# Patient Record
Sex: Male | Born: 1945 | Race: White | Hispanic: No | Marital: Married | Smoking: Never smoker
Health system: Southern US, Community
[De-identification: ages and names within clinical notes are randomized; demographics above are authoritative.]

## PROBLEM LIST (undated history)

## (undated) DIAGNOSIS — N189 Chronic kidney disease, unspecified: Secondary | ICD-10-CM

## (undated) DIAGNOSIS — Z941 Heart transplant status: Secondary | ICD-10-CM

## (undated) DIAGNOSIS — I10 Essential (primary) hypertension: Secondary | ICD-10-CM

## (undated) DIAGNOSIS — G4733 Obstructive sleep apnea (adult) (pediatric): Secondary | ICD-10-CM

## (undated) DIAGNOSIS — M109 Gout, unspecified: Secondary | ICD-10-CM

## (undated) DIAGNOSIS — E7800 Pure hypercholesterolemia, unspecified: Secondary | ICD-10-CM

## (undated) HISTORY — DX: Chronic kidney disease, unspecified: N18.9

## (undated) HISTORY — PX: HERNIA REPAIR: SHX51

## (undated) HISTORY — DX: Obstructive sleep apnea (adult) (pediatric): G47.33

## (undated) HISTORY — PX: CYSTECTOMY: SUR359

---

## 2000-08-27 HISTORY — PX: HEART TRANSPLANT: SHX268

## 2000-08-27 HISTORY — PX: PERICARDIUM SURGERY: SHX742

## 2000-11-11 ENCOUNTER — Ambulatory Visit (HOSPITAL_COMMUNITY): Admission: RE | Admit: 2000-11-11 | Discharge: 2000-11-11 | Payer: Self-pay | Admitting: Cardiology

## 2000-11-17 ENCOUNTER — Encounter: Payer: Self-pay | Admitting: Emergency Medicine

## 2000-11-17 ENCOUNTER — Encounter (INDEPENDENT_AMBULATORY_CARE_PROVIDER_SITE_OTHER): Payer: Self-pay | Admitting: Specialist

## 2000-11-17 ENCOUNTER — Inpatient Hospital Stay (HOSPITAL_COMMUNITY): Admission: EM | Admit: 2000-11-17 | Discharge: 2000-11-22 | Payer: Self-pay | Admitting: Emergency Medicine

## 2000-11-21 ENCOUNTER — Encounter: Payer: Self-pay | Admitting: Cardiology

## 2000-11-22 ENCOUNTER — Encounter: Payer: Self-pay | Admitting: Cardiology

## 2001-01-03 ENCOUNTER — Emergency Department (HOSPITAL_COMMUNITY): Admission: EM | Admit: 2001-01-03 | Discharge: 2001-01-03 | Payer: Self-pay | Admitting: Emergency Medicine

## 2001-04-01 ENCOUNTER — Encounter: Admission: RE | Admit: 2001-04-01 | Discharge: 2001-04-17 | Payer: Self-pay | Admitting: Internal Medicine

## 2001-05-20 ENCOUNTER — Encounter (HOSPITAL_COMMUNITY): Admission: RE | Admit: 2001-05-20 | Discharge: 2001-08-18 | Payer: Self-pay | Admitting: Cardiology

## 2001-08-19 ENCOUNTER — Encounter (HOSPITAL_COMMUNITY): Admission: RE | Admit: 2001-08-19 | Discharge: 2001-10-06 | Payer: Self-pay | Admitting: Cardiology

## 2003-08-28 HISTORY — PX: CARDIAC SURGERY: SHX584

## 2003-08-28 HISTORY — PX: ENDOMYOCARDIAL BIOPSY: SHX265

## 2003-11-21 ENCOUNTER — Inpatient Hospital Stay (HOSPITAL_COMMUNITY): Admission: EM | Admit: 2003-11-21 | Discharge: 2003-11-22 | Payer: Self-pay | Admitting: Emergency Medicine

## 2004-07-06 ENCOUNTER — Ambulatory Visit (HOSPITAL_COMMUNITY): Admission: RE | Admit: 2004-07-06 | Discharge: 2004-07-06 | Payer: Self-pay | Admitting: *Deleted

## 2004-10-10 ENCOUNTER — Emergency Department (HOSPITAL_COMMUNITY): Admission: EM | Admit: 2004-10-10 | Discharge: 2004-10-10 | Payer: Self-pay | Admitting: Emergency Medicine

## 2006-05-24 ENCOUNTER — Encounter: Admission: RE | Admit: 2006-05-24 | Discharge: 2006-05-24 | Payer: Self-pay | Admitting: Nephrology

## 2008-02-17 ENCOUNTER — Emergency Department (HOSPITAL_COMMUNITY): Admission: EM | Admit: 2008-02-17 | Discharge: 2008-02-17 | Payer: Self-pay | Admitting: Emergency Medicine

## 2008-08-27 HISTORY — PX: PERICARDIOCENTESIS: SHX2215

## 2008-10-18 ENCOUNTER — Encounter: Admission: RE | Admit: 2008-10-18 | Discharge: 2008-10-18 | Payer: Self-pay | Admitting: Surgical Oncology

## 2009-11-01 ENCOUNTER — Encounter (INDEPENDENT_AMBULATORY_CARE_PROVIDER_SITE_OTHER): Payer: Self-pay | Admitting: *Deleted

## 2009-11-04 ENCOUNTER — Encounter: Payer: Self-pay | Admitting: Gastroenterology

## 2009-11-10 ENCOUNTER — Encounter (INDEPENDENT_AMBULATORY_CARE_PROVIDER_SITE_OTHER): Payer: Self-pay | Admitting: *Deleted

## 2009-11-11 ENCOUNTER — Ambulatory Visit: Payer: Self-pay | Admitting: Gastroenterology

## 2009-11-15 ENCOUNTER — Encounter: Payer: Self-pay | Admitting: Gastroenterology

## 2009-11-16 ENCOUNTER — Telehealth: Payer: Self-pay | Admitting: Gastroenterology

## 2009-11-28 ENCOUNTER — Telehealth (INDEPENDENT_AMBULATORY_CARE_PROVIDER_SITE_OTHER): Payer: Self-pay | Admitting: *Deleted

## 2009-11-28 ENCOUNTER — Encounter (INDEPENDENT_AMBULATORY_CARE_PROVIDER_SITE_OTHER): Payer: Self-pay | Admitting: *Deleted

## 2009-12-29 ENCOUNTER — Ambulatory Visit (HOSPITAL_COMMUNITY): Admission: RE | Admit: 2009-12-29 | Discharge: 2009-12-29 | Payer: Self-pay | Admitting: Gastroenterology

## 2009-12-29 ENCOUNTER — Ambulatory Visit: Payer: Self-pay | Admitting: Gastroenterology

## 2010-09-28 NOTE — Progress Notes (Signed)
Summary: Colon  Phone Note Outgoing Call Call back at Home Phone [phone]   Call placed by: Christian Mate CMA Deborra Medina),  November 28, 2009 9:29 AM Summary of Call: pt scheduled for colon meds reviewed and instructed.  will call is any questions after recieving in the mail.  pt already has prep. Initial call taken by: Christian Mate CMA Deborra Medina),  November 28, 2009 9:29 AM  New Problems: SPECIAL SCREENING FOR MALIGNANT NEOPLASMS COLON (ICD-V76.51)   New Problems: SPECIAL SCREENING FOR MALIGNANT NEOPLASMS COLON (ICD-V76.51)

## 2010-09-28 NOTE — Letter (Signed)
Summary: [facility]   Imported By: Phillis Knack 11/29/2009 09:26:40  _____________________________________________________________________  External Attachment:    Type:   Image     Comment:   External Document

## 2010-09-28 NOTE — Miscellaneous (Signed)
Summary: previsit/rm  Clinical Lists Changes  Medications: Added new medication of MOVIPREP 100 GM  SOLR (PEG-KCL-NACL-NASULF-NA ASC-C) As per prep instructions. - Signed Rx of MOVIPREP 100 GM  SOLR (PEG-KCL-NACL-NASULF-NA ASC-C) As per prep instructions.;  #1 x 0;  Signed;  Entered by: Sundra Aland RN;  Authorized by: Milus Banister MD;  Method used: Electronically to [facility] [phone]*, [address], [place], [place]  [address], Ph: [phone], Fax: [phone] Observations: Added new observation of ALLERGY REV: Done (11/11/2009 13:42) Added new observation of NKA: T (11/11/2009 13:42)    Prescriptions: MOVIPREP 100 GM  SOLR (PEG-KCL-NACL-NASULF-NA ASC-C) As per prep instructions.  #1 x 0   Entered by:   Sundra Aland RN   Authorized by:   Milus Banister MD   Signed by:   Sundra Aland RN on 11/11/2009   Method used:   Electronically to        [facility] [phone]* (retail)       [address]       [place], [place]  [address]       Ph: [phone]       Fax: [phone]   RxID:   [id number]

## 2010-09-28 NOTE — Procedures (Signed)
Summary: Colonoscopy  Patient: [name] Note: All result statuses are Final unless otherwise noted.  Tests: (1) Colonoscopy (COL)   COL Colonoscopy           [facility]     [place], [place]  [address]           COLONOSCOPY PROCEDURE REPORT           PATIENT:  Brian Suarez, Brian Suarez  MR#:  [id number]     BIRTHDATE:  04/10/1946, 63 yrs. old  GENDER:  male     ENDOSCOPIST:  Milus Banister, MD     REF. BY:  Jani Gravel, M.D.     PROCEDURE DATE:  12/29/2009     PROCEDURE:  Diagnostic Colonoscopy     ASA CLASS:  Class II     INDICATIONS:  Routine Risk Screening; colonoscopy 06/2004 with Dr.     Lajoyce Corners found no polyps, was recommended to have repeat in 5 years     MEDICATIONS:   MAC sedation, administered by CRNA           DESCRIPTION OF PROCEDURE:   After the risks benefits and     alternatives of the procedure were thoroughly explained, informed     consent was obtained.  Digital rectal exam was performed and     revealed no rectal masses.   The  endoscope was introduced through     the anus and advanced to the cecum, which was identified by both     the appendix and ileocecal valve, without limitations.  The     quality of the prep was good, using MoviPrep.  The instrument was     then slowly withdrawn as the colon was fully examined.     <<PROCEDUREIMAGES>>     FINDINGS:  Mild diverticulosis was found in the sigmoid to     descending colon segments (see image3).  This was otherwise a     normal examination of the colon (see image4, image2, and image1).     Retroflexed views in the rectum revealed no abnormalities.    The     scope was then withdrawn from the patient and the procedure     completed.           COMPLICATIONS:  None           ENDOSCOPIC IMPRESSION:     1) Mild diverticulosis in the sigmoid to descending colon     segments     2) Otherwise normal examination; no polyps or cancers           RECOMMENDATIONS:     1) Continue current  colorectal screening recommendations for     "routine risk" patients with a repeat colonoscopy in 10 years.           REPEAT EXAM:  10 years           ______________________________     Milus Banister, MD           n.     eSIGNED:   Milus Banister at 12/29/2009 10:30 AM           Eugenio Hoes, [id number]  Note: An exclamation mark (!) indicates a result that was not dispersed into the flowsheet. Document Creation Date: 12/29/2009 10:31 AM _______________________________________________________________________  (1) Order result status: Final Collection or observation date-time: 12/29/2009 10:20 Requested date-time:  Receipt date-time:  Reported date-time:  Referring  Physician:   Ordering Physician: Owens Loffler [phone]) Specimen Source:  Source: Tawanna Cooler Order Number: [id number] Lab site:

## 2010-09-28 NOTE — Letter (Signed)
Summary: [facility] Instructions  [facility]  [place], [place] [address]   Phone: [phone]  Fax: [phone]       Brian Suarez    11-Feb-1946    MRN: [id number]        Procedure Day /Date:12/29/09     Arrival Time:8 am       Procedure Time:10 am     Location of Procedure:                     X  [facility] ( Outpatient Registration)                        PREPARATION FOR COLONOSCOPY WITH MOVIPREP   Starting 5 days prior to your procedure 12/23/09  do not eat nuts, seeds, popcorn, corn, beans, peas,  salads, or any raw vegetables.  Do not take any fiber supplements (e.g. Metamucil, Citrucel, and Benefiber).  THE DAY BEFORE YOUR PROCEDURE         DATE: 12/28/09  DAY:   WED _ 1.  Drink clear liquids the entire day-NO SOLID FOOD  2.  Do not drink anything colored red or purple.  Avoid juices with pulp.  No orange juice.  3.  Drink at least 64 oz. (8 glasses) of fluid/clear liquids during the day to prevent dehydration and help the prep work efficiently.  CLEAR LIQUIDS INCLUDE: Water Jello Ice Popsicles Tea (sugar ok, no milk/cream) Powdered fruit flavored drinks Coffee (sugar ok, no milk/cream) Gatorade Juice: apple, white grape, white cranberry  Lemonade Clear bullion, consomm, broth Carbonated beverages (any kind) Strained chicken noodle soup Hard Candy                             4.  In the morning, mix first dose of MoviPrep solution:    Empty 1 Pouch A and 1 Pouch B into the disposable container    Add lukewarm drinking water to the top line of the container. Mix to dissolve    Refrigerate (mixed solution should be used within 24 hrs)  5.  Begin drinking the prep at 5:00 p.m. The MoviPrep container is divided by 4 marks.   Every 15 minutes drink the solution down to the next mark (approximately 8 oz) until the full liter is complete.   6.  Follow completed prep with 16 oz of clear liquid of your choice (Nothing red or  purple).  Continue to drink clear liquids until bedtime.  7.  Before going to bed, mix second dose of MoviPrep solution:    Empty 1 Pouch A and 1 Pouch B into the disposable container    Add lukewarm drinking water to the top line of the container. Mix to dissolve    Refrigerate  THE DAY OF YOUR PROCEDURE      DATE: 12/29/09 DAY: THUR  Beginning at 5 a.m. (5 hours before procedure):         1. Every 15 minutes, drink the solution down to the next mark (approx 8 oz) until the full liter is complete.  Nothing to eat or drink after midnight except the prep solution  MEDICATION INSTRUCTIONS  Unless otherwise instructed, you should take regular prescription medications with a small sip of water   as early as possible the morning of your procedure.         OTHER INSTRUCTIONS  You will need a  responsible adult at least 65 years of age to accompany you and drive you home.   This person must remain in the waiting room during your procedure.  Wear loose fitting clothing that is easily removed.  Leave jewelry and other valuables at home.  However, you may wish to bring a book to read or  an iPod/MP3 player to listen to music as you wait for your procedure to start.  Remove all body piercing jewelry and leave at home.  Total time from sign-in until discharge is approximately 2-3 hours.  You should go home directly after your procedure and rest.  You can resume normal activities the  day after your procedure.  The day of your procedure you should not:   Drive   Make legal decisions   Operate machinery   Drink alcohol   Return to work  You will receive specific instructions about eating, activities and medications before you leave.    The above instructions have been reviewed and explained to me by   Christian Mate CMA Deborra Medina)  November 28, 2009 9:45 AM     I fully understand and can verbalize these instructions over the phone mailed to home Date 11/28/09

## 2010-09-28 NOTE — Letter (Signed)
Summary: Previsit letter  [facility]  [place], [place] [address]   Phone: [phone]  Fax: [phone]       11/01/2009 MRN: [id number]  [facility] [place], [place]  [address]  Dear Brian Suarez,  Welcome to the Gastroenterology Division at [facility].    You are scheduled to see a nurse for your pre-procedure visit on 11-15-09 at 3:30p.m. on the 3rd floor at [facility], [place] [employer].  We ask that you try to arrive at our office 15 minutes prior to your appointment time to allow for check-in.  Your nurse visit will consist of discussing your medical and surgical history, your immediate family medical history, and your medications.    Please bring a complete list of all your medications or, if you prefer, bring the medication bottles and we will list them.  We will need to be aware of both prescribed and over the counter drugs.  We will need to know exact dosage information as well.  If you are on blood thinners (Coumadin, Plavix, Aggrenox, Ticlid, etc.) please call our office today/prior to your appointment, as we need to consult with your physician about holding your medication.   Please be prepared to read and sign documents such as consent forms, a financial agreement, and acknowledgement forms.  If necessary, and with your consent, a friend or relative is welcome to sit-in on the nurse visit with you.  Please bring your insurance card so that we may make a copy of it.  If your insurance requires a referral to see a specialist, please bring your referral form from your primary care physician.  No co-pay is required for this nurse visit.     If you cannot keep your appointment, please call [phone] to cancel or reschedule prior to your appointment date.  This allows [place] the opportunity to schedule an appointment for another patient in need of care.    Thank you for choosing [facility] for your  medical needs.  We appreciate the opportunity to care for you.  Please visit [place] at our website  to learn more about our practice.                     Sincerely.                                                                                                                   The Gastroenterology Division

## 2010-09-28 NOTE — Letter (Signed)
Summary: Moviprep Instructions  [facility]  [address]   [place], [place] [address]   Phone: [phone]  Fax: [phone]       ANGELOS OTTMAR    1946-07-25    MRN: [id number]        Procedure Day Sudie Grumbling: 11-29-09, Tuesday     Arrival Time: 9:00 a.m.      Procedure Time: 10:00 a.m.     Location of Procedure:                    x   [facility] (4th Floor)                        [facility]   Starting 5 days prior to your procedure  11-24-09 do not eat nuts, seeds, popcorn, corn, beans, peas,  salads, or any raw vegetables.  Do not take any fiber supplements (e.g. Metamucil, Citrucel, and Benefiber).  THE DAY BEFORE YOUR PROCEDURE         DATE:  11-28-09   DAY: Monday  1.  Drink clear liquids the entire day-NO SOLID FOOD  2.  Do not drink anything colored red or purple.  Avoid juices with pulp.  No orange juice.  3.  Drink at least 64 oz. (8 glasses) of fluid/clear liquids during the day to prevent dehydration and help the prep work efficiently.  CLEAR LIQUIDS INCLUDE: Water Jello Ice Popsicles Tea (sugar ok, no milk/cream) Powdered fruit flavored drinks Coffee (sugar ok, no milk/cream) Gatorade Juice: apple, white grape, white cranberry  Lemonade Clear bullion, consomm, broth Carbonated beverages (any kind) Strained chicken noodle soup Hard Candy                             4.  In the morning, mix first dose of MoviPrep solution:    Empty 1 Pouch A and 1 Pouch B into the disposable container    Add lukewarm drinking water to the top line of the container. Mix to dissolve    Refrigerate (mixed solution should be used within 24 hrs)  5.  Begin drinking the prep at 5:00 p.m. The MoviPrep container is divided by 4 marks.   Every 15 minutes drink the solution down to the next mark (approximately 8 oz) until the full liter is complete.   6.  Follow completed prep with 16 oz of clear liquid of your choice  (Nothing red or purple).  Continue to drink clear liquids until bedtime.  7.  Before going to bed, mix second dose of MoviPrep solution:    Empty 1 Pouch A and 1 Pouch B into the disposable container    Add lukewarm drinking water to the top line of the container. Mix to dissolve    Refrigerate  THE DAY OF YOUR PROCEDURE      DATE: 11-29-09   DAY: Tuesday  Beginning at 5:00 a.m. (5 hours before procedure):         1. Every 15 minutes, drink the solution down to the next mark (approx 8 oz) until the full liter is complete.  2. Follow completed prep with 16 oz. of clear liquid of your choice.    3. You may drink clear liquids until 8:00 a.m.  (2 HOURS BEFORE PROCEDURE).   MEDICATION INSTRUCTIONS  Unless otherwise instructed, you should take regular prescription medications with a small sip of water  as early as possible the morning of your procedure.   Additional medication instructions: n/a         OTHER INSTRUCTIONS  You will need a responsible adult at least 65 years of age to accompany you and drive you home.   This person must remain in the waiting room during your procedure.  Wear loose fitting clothing that is easily removed.  Leave jewelry and other valuables at home.  However, you may wish to bring a book to read or  an iPod/MP3 player to listen to music as you wait for your procedure to start.  Remove all body piercing jewelry and leave at home.  Total time from sign-in until discharge is approximately 2-3 hours.  You should go home directly after your procedure and rest.  You can resume normal activities the  day after your procedure.  The day of your procedure you should not:   Drive   Make legal decisions   Operate machinery   Drink alcohol   Return to work  You will receive specific instructions about eating, activities and medications before you leave.    The above instructions have been reviewed and explained to me by   Sundra Aland RN   November 11, 2009 2:34 PM     I fully understand and can verbalize these instructions _____________________________ Date _________

## 2010-09-28 NOTE — Progress Notes (Signed)
Summary: prep type  Phone Note Call from Patient Call back at Home Phone [phone]   Caller: Patient Call For: Dr. Ardis Hughs Reason for Call: Talk to Nurse Summary of Call: pt says that his kidney doctor would like our office to call him and let him know what type of prep pt used for his COL and what is in it... Dr. Salem Senate, [phone] Initial call taken by: Lucien Mons,  November 16, 2009 12:42 PM  Follow-up for Phone Call        called and gave the nurse at Dr Fox's office the name of the prep.  (Movi prep).     Appended Document: prep type can you please cancel his upcoming [place] colonscopy (next tuesday).  He had a heart transplant in 2002, has significant kidney disfuntion (creatinine of 2), I think he is safest NOT to be done in [place].  He will need to be changed to anesthesia assisted (propofol) case at [facility], next available EUS thursday.  Appended Document: prep type appt cx will call pt next week to reschedule  Appended Document: prep type colon scheduled

## 2010-11-14 LAB — CBC
HCT: 40.2 % (ref 39.0–52.0)
Hemoglobin: 13.1 g/dL (ref 13.0–17.0)
Platelets: 140 10*3/uL — ABNORMAL LOW (ref 150–400)
RBC: 4.46 MIL/uL (ref 4.22–5.81)
WBC: 8.3 10*3/uL (ref 4.0–10.5)

## 2010-11-14 LAB — COMPREHENSIVE METABOLIC PANEL
Albumin: 4 g/dL (ref 3.5–5.2)
Alkaline Phosphatase: 72 U/L (ref 39–117)
BUN: 45 mg/dL — ABNORMAL HIGH (ref 6–23)
CO2: 27 mEq/L (ref 19–32)
Chloride: 104 mEq/L (ref 96–112)
GFR calc non Af Amer: 26 mL/min — ABNORMAL LOW (ref >60)
Potassium: 4.6 mEq/L (ref 3.5–5.1)
Total Bilirubin: 1.7 mg/dL — ABNORMAL HIGH (ref 0.3–1.2)

## 2010-11-14 LAB — DIFFERENTIAL
Basophils Absolute: 0 10*3/uL (ref 0.0–0.1)
Basophils Relative: 0 % (ref 0–1)
Eosinophils Relative: 1 % (ref 0–5)
Monocytes Absolute: 0.8 10*3/uL (ref 0.1–1.0)
Neutro Abs: 6.1 10*3/uL (ref 1.7–7.7)

## 2010-11-27 ENCOUNTER — Other Ambulatory Visit: Payer: Self-pay | Admitting: Dermatology

## 2011-01-12 NOTE — Op Note (Signed)
NAME:  Brian Suarez, STOCKS NO.:  [id number]   MEDICAL RECORD NO.:  [id number]          PATIENT TYPE:  AMB   LOCATION:  ENDO                         FACILITY:  [facility]   PHYSICIAN:  Waverly Ferrari, M.D.    DATE OF BIRTH:  06-06-1946   DATE OF PROCEDURE:  DATE OF DISCHARGE:                                 OPERATIVE REPORT   PROCEDURE:  Colonoscopy.   SURGEON:   INDICATIONS FOR PROCEDURE:  Colon cancer screening.   ANESTHESIA:  Demerol 75 and Versed 5 mg.   DESCRIPTION OF PROCEDURE:  With the patient mildly sedated in the left  lateral decubitus position, a rectal examination was performed which was  unremarkable.  Subsequently the Olympus videoscopic colonoscope was inserted  into the rectum, passed under direct vision to the cecum, identified by  ileocecal valve and appendiceal orifice both of which were photographed.  From this point the colonoscope was slowly withdrawn taking circumferential  views of the colonic mucosa, stopping only in the rectum which appeared  normal with rectum retroflexed view.  The endoscope was straightened and  withdrawn.  The patient's vital signs and pulse oximeter remained stable.  The patient tolerated the procedure well without apparent complications.   FINDINGS:  Unremarkable examination.   PLAN:  Have patient follow up with me on an as needed basis.       GMO/MEDQ  D:  07/06/2004  T:  07/06/2004  Job:  [id number]

## 2011-01-12 NOTE — Discharge Summary (Signed)
[place] [place] [facility]  Patient:    Brian Suarez, Brian Suarez                       MRN: [id number] Adm. Date:  [id number] Disc. Date: [id number] Attending:  Molpus, John L CC:         Richard A. Rollene Fare, M.D.  John R. Glade Lloyd, M.D.  Theodoro Parma. Francina Ames., M.D.   Discharge Summary  CHIEF COMPLAINT:  Shortness of breath.  SUMMARY:  This 65 year old white male, patient of Dr. Harley Alto and Dr. Roe Rutherford, who has had an known dilated cardiomyopathy for approximately seven or eight years, had been noted to develop a pericardial effusion within the past month.  This effusion was tapped on November 11, 2000. The volume of drainage was 850 cc.  His symptoms improved over the next several day but at the time of admission he had severely increased dyspnea over approximately three days.  DETAILS OF PAST MEDICAL, FAMILY, AND SOCIAL HISTORY:  To be found a relevant degree in the history and physical.  LABORATORY DATA:  Chest x-ray showed pulmonary edema and cardiac enlargement.  Creatinine 1.2, BUN 17, sodium 130, potassium 5.0.  Slightly elevated liver enzymes.  ALT 93, AST 46, albumin 3.0.  White cells 11,700, hematocrit 40, mean cell volume 35, platelets 262,000.  CPK with MB and troponin I unremarkable.  Blood culture was negative.  Fluid culture was negative.  COURSE IN HOSPITAL:  He was admitted to a telemetry bed and placed on oxygen. His condition did not improve significantly.  He was noted to have very marked pulsus paradoxus with radial pulses not palpable with inspiration, consistent with a systolic drop of at least 40 to 50 mmHg, and remained markedly dyspneic with minimal exertion.  He was seen in consultation by the cardiovascular thoracic surgery service, and on November 21, 2000 pericardial window was placed by Dr. Arlyce Dice.  Specimens were sent.  He had the rapid onset of severe hemodynamic decompensation following that procedure, and developed  acute pulmonary edema requiring intubation.  He developed severe hypotension.  He was seen in consultation by Dr. Rollene Fare and by the critical care service. With very aggressive hemodynamic measures they were able to keep the patient reasonably stable overnight.  The following day Dr. Rollene Fare made arrangements to transfer the patient to [facility] for possible cardiac transplant. This was carried out on November 22, 2000.  He was transferred at 9:40 a.m.  FINAL DIAGNOSES: 1. 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 2. Pericardial tamponade. 3. Severe hemodynamic decompensation following placement of pericardial    window, with severe hypotension and florid pulmonary edema, refractory    to therapy. 4. Left bundle branch block.  OPERATIONS:  Pericardiotomy with pericardial biopsy.  COMPLICATIONS:  Severe hemodynamic decompensation/cardiogenic shock.  CONDITION ON DISCHARGE:  Stable on continuous ventilation.  RETURN TO WORK:  Inapplicable. DD:  01/29/01 TD:  01/29/01 Job: [id number]

## 2011-01-12 NOTE — Op Note (Signed)
NAME:  Brian Suarez, Brian Suarez NO.:  [id number]   MEDICAL RECORD NO.:  [id number]          PATIENT TYPE:  AMB   LOCATION:  ENDO                         FACILITY:  [facility]   PHYSICIAN:  Waverly Ferrari, M.D.    DATE OF BIRTH:  [birth year]   DATE OF PROCEDURE:  07/06/2004  DATE OF DISCHARGE:                                 OPERATIVE REPORT   PROCEDURE PERFORMED:  Upper endoscopy.   ENDOSCOPIST:  Waverly Ferrari, M.D.   INDICATIONS FOR PROCEDURE:  Gastroesophageal reflux disease.   ANESTHESIA:  Versed 2.5 mg.   DESCRIPTION OF PROCEDURE:  With the patient mildly sedated in the left  lateral decubitus position, the Olympus videoscopic endoscope was inserted  in the mouth and passed under direct vision through the esophagus which  appeared normal.  There was no evidence of Barrett's esophagus.  We entered  into the stomach.  The fundus, body, antrum, duodenal bulb and second  portion of the duodenum were visualized.  From this point, the endoscope was  slowly withdrawn taking circumferential views of the duodenal mucosa until  the endoscope was pulled back into the stomach and placed on retroflexion to  view the stomach from below.  The endoscope was then straightened and  withdrawn taking circumferential views of the remaining gastric and  esophageal mucosa.  The patient's vital signs and pulse oximeter remained  stable.  The patient tolerated the procedure well without apparent  complications.   FINDINGS:  Unremarkable examination.   PLAN:  Proceed to colonoscopy.       GMO/MEDQ  D:  07/06/2004  T:  07/06/2004  Job:  [id number]

## 2011-01-12 NOTE — H&P (Signed)
[place]. [facility]  Patient:    Brian Suarez, Brian Suarez                       MRN: [id number] Adm. Date:  [id number] Attending:  Jeannine Kitten CC:         Freeman Caldron. Pauline Aus, M.D.  Theodoro Parma. Francina Ames., M.D.   History and Physical  PREOPERATIVE DIAGNOSES:  Recurrent pericardial effusion, pulmonary hypertension, dilated cardiomyopathy.  POSTOPERATIVE DIAGNOSES:  Recurrent pericardial effusion, pulmonary hypertension, dilated cardiomyopathy.  OPERATION PERFORMED:  Subxiphoid pericardial ______.  SURGEON:  D. Marlyn Corporal, M.D.  FIRST ASSISTANT:  Claudette Knell.  ANESTHESIA:  General.  This patient developed pericardial effusion and underwent a tap that showed a bloody effusion.  Cultures and cytology were apparently negative but the effusion occurred rapidly.  He was brought to the operating room for drainage. He had had some mild symptoms on echocardiogram of ______.  After general anesthesia an endotracheal tube was inserted and he was prepped and draped in the usual sterile manner.  An incision was made from the xiphoid about 6-7 cm down toward the umbilicus.  Dissection was carried down through the subcutaneous tissues through the pretracheal fascia.  The dissection was carried up underneath the xiphoid and the sternum up to the pericardium.  The pericardium was grasped with a ______ clamp and then opened with knives and dark bloody effusion emerged.  He had some mild increase in his pressure 10-15 points but he still maintained his tachycardia.  Pericardial fluid was sent again for cytologies and again for cultures and then he was opened approximately 3-4 cm.  Then a ethicon stapler was inserted and with three or four applications of the stapler the lesion of the pericardium was removed and sent for frozen section which revealed an inflammatory reaction.  Another piece of pericardium was taken with the stapler laterally and this was sent for  permanent section.  After approximately a 6 x 5 cm piece of opening was made two chest tubes were brought in through separate stab wounds on each side of the incision, a right angle chest tube on the right side and a straight chest tube on the left side.  Then the fascia was closed with 1-0 Vicryl in an interrupted fashion, 2-0 Vicryl in the subcutaneous tissue, and Steri-Strips. Patient was returned to recovery room in stable condition. DD:  11/21/00 TD:  11/21/00 Job: [id number]

## 2011-01-12 NOTE — Procedures (Signed)
[place]. [facility]  Patient:    Brian Suarez, Brian Suarez                       MRN: [id number] Adm. Date:  [id number] Disc. Date: [id number] Attending:  Jeannine Kitten CC:         Freeman Caldron. Pauline Aus, M.D.  John R. Glade Lloyd, M.D.  Theodoro Parma. Francina Ames., M.D.  Cardiac Catheterization Laboratory   Procedure Report  PROCEDURE:  Insertion of intra-aortic balloon pump, percutaneous technique, right femoral artery.  BRIEF HISTORY:  Brian Suarez is a 65 year old white married gentleman who is a longterm patient of Dr. Harley Alto and Drs. Grove and [facility] Pride.  He has an idiopathic, nonischemic cardiomyopathy with past estimated EF in the 20-25% range with associated pulmonary hypertension.  He has apparently been seen at [facility] approximately five to six years ago.  He has gotten along fairly well on medical therapy, which has included digitalis, Aldactone, ACE inhibitors, Coreg 3.125 mg b.i.d., chronic Coumadin therapy, and Lasix.  The patient is married with three sons and was working at [employer].  He developed a pericardial effusion and underwent elective pericardiocentesis by Dr. Glade Lloyd on November 01, 2000, in which the pericardial effusion was drained of approximately 850 cc of serosanguineous fluid.  The patient apparently developed increasing symptoms of shortness of breath and chest discomfort.  He was readmitted to the hospital on November 17, 2000, with severe congestive heart failure.  He was found to have a recurrent large pericardial effusion, and Dr. Pauline Aus felt that he might have some tamponade physiology.  Blood pressures have remained low in the 95 mmHg range chronically.  Cardiovascular surgical consultation was obtained with Dr. Arlyce Dice, and the patients Coumadin was held.  The patient had been receiving diuretics in the hospital and had prerenal azotemia with BUN and creatinine initially 17/1.2, subsequently  rising to 1.6 and BUN of 40 with diuresis and pericardial effusion.  Borderline TSH elevation of 5.6.  On November 21, 2000, he underwent pericardial window by subxiphoid approach by Dr. Arlyce Dice with pericardial biopsy and drainage.  Fluids were sent for appropriate cytology, culture, AFB, and fungus.  He came back to the PACU in stable condition with two chest tubes.  He subsequently developed progressive hypotension requiring dopamine and progressive hypoxemia, with blood pressures in the 70 mmHg range.  He was seen by me earlier this evening on high-dose dopamine with blood pressures of 60-70 mmHg.  He was intubated on 100% O2 with O2 desaturation and PO2s in the 70s.  He had had only scant urine output with high-dose diuretics.  His hypotension was felt to be related to possible relative intravascular hypovolemia, and he was initially given fluids.  A Swan-Ganz catheter was inserted percutaneously through the right subclavian vein without difficulty, and pressures measured PA 57/32; PCW mean [id number] with a systolic blood pressure of 75 on high-dose dopamine.  His chest x-ray showed severe bilateral pulmonary edema.  The patient remained intubated but conscious and appeared oriented.  He was seen in consultation by Dr. Asencion Noble of critical care for ventilator management and had fever.  Appropriate cultures were obtained, and he was started on Zosyn 4.5 g q.8h.  He was titrated off dopamine and begun on a Levophed drip in the hopes that this would have less chronotropic effect.  It was felt best to proceed with intra-aortic balloon counterpulsation in this setting of severe bilateral pulmonary edema and persistent  hypotension poorly responding to pressors.  DESCRIPTION OF PROCEDURE:  The patient was brought to the second floor CP lab. The right groin was prepped and draped in the usual manner.  Xylocaine 1% was used for local anesthesia.  The RFA was entered with a single  anterior puncture using an 18 thin-walled needle and using standard technique.  A #8 French Datascope 40 cc IABP was inserted under fluoroscopic control over the guidewire.  The guidewire was removed, and IABP counterpulsation was begun at 1 to 2 in view of his sinus tachycardia with left bundle branch block.  Initial pressures rose to 80/57 with augmented pressures of 70 and a heart rate of 110-115 with sinus tachycardia and bundle branch block.  The IABP was secured with #1 silk sutures and dressed, and the patient was transferred to the CCU for postoperative care.  We hope to continue his pressors and hopefully add milrinone for his severe biventricular failure and pulmonary edema.  The patient is 65 years of age and may be a candidate for consideration for cardiac transplantation.  Since he had been seen at [facility], we hope to contact them in the a.m. while we try to stabilize him hemodynamically tonight.  We would hope to effect transfer to a transplant center as soon as possible in view of his clinical condition and underlying diagnosis.  If the patient responds to IABP and pressor therapy and is able to be weaned off this and if transplant is not available and he can be stabilized, he may well be a candidate for biventricular pacing.  I suspect cardiac transplantation for this gentleman, however, would be the best overall approach.  DIAGNOSES: 1. Nonischemic cardiomyopathy. 2. Acute hypotension, pressor-dependent and poorly responsive. 3. Successful intra-aortic balloon pump insertion. 4. Remote normal coronary arteries. 5. Prerenal azotemia, probably related to recent diuresis and low cardiac    output with poor renal perfusion. 6. Chronic Coumadin therapy, on hold. 7. Borderline thyroid stimulating hormone elevation. DD:  11/21/00 TD:  11/22/00 Job: [id number]

## 2011-01-12 NOTE — Cardiovascular Report (Signed)
[place]. [facility]  Patient:    Brian Suarez, Brian Suarez                       MRN: [id number] Proc. Date: 11/11/00 Adm. Date:  [id number] Attending:  Lisbeth Renshaw CC:         Theodoro Parma. Francina Ames., M.D.  Cardiac Catheterization Laboratory   Cardiac Catheterization  REFERRING PHYSICIAN:  Theodoro Parma. Francina Ames., M.D.  PROCEDURE:  Elective pericardiocentesis.  INDICATIONS FOR PROCEDURE:  This 65 year old male has a history of a dilated cardiomyopathy with ejection fraction of 20-25% and pulmonary hypertension. He has had a recent echocardiogram which showed a large pericardial effusion. With increasing symptoms of  dyspnea and weakness it was felt that pericardial effusion should be tapped in order to maximize his compromised myocardial function.  DESCRIPTION OF PROCEDURE:  After signing an informed consent, the patient was brought to the cardiac catheterization lab where his mid lower chest was prepped and draped in a sterile fashion.  He was given 2 mg of Versed as a premed.  His mid lower chest over the epigastrium was then prepped and draped in a sterile fashion and a mid epigastric site was anesthetized locally with 1% lidocaine.  An 18-gauge thin-walled needle was then inserted percutaneously, and with ECG guidance with the V lead attached to the needle, it was advanced into the pericardium.  There was initial blood withdrawn and there was contact with the myocardium as shown on the ECG with an acute injury pattern typical of touching the surface of the heart.  After withdrawing the needle slightly, we had free pericardial fluid withdrawal.  We then inserted a Seldinger wire through the 18-gauge thin-walled needle and advanced the Seldinger wire into the pericardial space.  We then advanced a 6 [place] multi, side-hole, pigtail catheter over the Seldinger wire into the pericardial space and attached this catheter to a negative pressure collection  bottle.  We then collected freely a bloody, non-clotting fluid from the pericardium for a total of 850 cc.  After the fluid stopped, the catheter was removed and the collected sample was sent to the lab for analysis as per orders of Dr. Conley Canal. Fluoroscopy, during the procedure showed a marked pericardial effusion at the beginning of the procedure and final fluoroscopy showed total clearing of the effusion.  The patient tolerated the procedure well and no complications were noted.  At the end of the procedure, a Band-Aid was applied to the puncture site and he was returned to the short-stay unit in stable condition.  FINAL RESULTS: 1. Large pericardial effusion. 2. Successful pericardiocentesis with traumatic tap. 3. Total drainage of the pericardial effusion.  DISPOSITION:  Total collection of the pericardial fluid was sent to the lab for chemistry analysis as per Dr. Conley Canal.  He will be able to return home later today from the short-stay unit.  He was instructed to have a followup with Dr. Conley Canal when the labs were completed. DD:  11/11/00 TD:  11/11/00 Job: [id number]

## 2011-01-12 NOTE — Discharge Summary (Signed)
NAME:  Brian Suarez, Brian Suarez NO.:  [id number]   MEDICAL RECORD NO.:  [id number]                   PATIENT TYPE:  INP   LOCATION:  2908                                 FACILITY:  [facility]   PHYSICIAN:  Bryson Dames, M.D.             DATE OF BIRTH:  Jun 01, 1946   DATE OF ADMISSION:  11/20/2003  DATE OF DISCHARGE:  11/22/2003                                 DISCHARGE SUMMARY   DISCHARGE DIAGNOSES:  1. Acute dizziness and nausea and vomiting, resolved.  2. Mildly abnormal EKG with negative cardiac enzymes.  3. Renal insufficiency with elevated creatinine.  4. Status post heart transplant in 2002.   HISTORY OF PRESENT ILLNESS:  A 65 year old gentleman patient of Dr. Pauline Aus  presented to the emergency room with onset of nausea, vomiting four times  and dizziness the night of presentation.  He denied shortness of breath or  chest pain.  During evaluation in the emergency room, his BUN and creatinine  were elevated and the patient developed some mild EKG changes.   He was admitted on rule out MI protocol to the telemetry unit.   The first night in the hospital went uneventfully and his cardiac enzymes  were cycled.  The first set of enzymes in the emergency room was negative  and the second set in the early morning revealed normal values also.  His  EKG shows right bundle branch block and T-wave inversion in the precordial  leads, V1, V2 and V3 and also in inferior leads II and aVF.   Dr. Melvern Banker held discussion with Dr. Pauline Aus, the patient's cardiologist, and  decided that if the patient remains pain-free and improves with nausea,  vomiting, and his enzymes remain negative, he might be considered for  discharge home.  In case his enzymes reveal abnormal values, he would need a  catheterization or CT.  He would need a CT __________, he would need a CT to  rule out coronary anatomy versus __________ cardiac catheterization with  elevated creatinine.   His enzymes  remained negative and the morning of his discharge, he was  evaluated by Dr. Melvern Banker.  He was free of chest pain.  Creatinine fell from  2.4 to 1.9.  He did not complain of any shortness of breath, nausea and  vomiting also resolved.  Per patient, he had a gallbladder ultrasound at  [facility] prior to his presentation.  That ultrasound was negative for  gallstones.   LABORATORY DATA:  CBC showed white blood cell count 5.0, hemoglobin 11.3,  hematocrit 32.8, platelets 161.  Sodium 148, potassium 4.0, chloride 105,  CO2 27, glucose 119, BUN 22, and creatinine 1.9.  Cardiac enzymes x3 were  negative.  AST 23, ALT 18, alkaline phosphatase 72.  Total cholesterol 134,  triglycerides 111, HDL 29, LDL 83.   The patient was discharged home in stable condition.   DISCHARGE MEDICATIONS:  Resume prior home medications.  FOLLOW UP:  He was instructed to make an appointment with Dr. Pauline Aus to be  seen in a couple of weeks.  He is also to see Dr. Conley Canal this week.      York Grice, P.A.                    Bryson Dames, M.D.    MK/MEDQ  D:  01/10/2004  T:  01/10/2004  Job:  [id number]   cc:   Dr. Henreitta Leber D. Pauline Aus, M.D.  [address] [place]  [place] [address]  Fax: [phone]

## 2011-05-24 LAB — DIFFERENTIAL
Basophils Relative: 0
Eosinophils Absolute: 0
Monocytes Absolute: 0.9
Monocytes Relative: 6

## 2011-05-24 LAB — URINALYSIS, ROUTINE W REFLEX MICROSCOPIC
Glucose, UA: NEGATIVE
Hgb urine dipstick: NEGATIVE
Ketones, ur: NEGATIVE
Protein, ur: NEGATIVE

## 2011-05-24 LAB — CBC
Hemoglobin: 14.2
Platelets: 159
RDW: 13.8

## 2011-05-24 LAB — COMPREHENSIVE METABOLIC PANEL
ALT: 21
Albumin: 3.4 — ABNORMAL LOW
Alkaline Phosphatase: 78
Potassium: 3.9
Sodium: 132 — ABNORMAL LOW
Total Protein: 6.2

## 2011-11-26 ENCOUNTER — Other Ambulatory Visit: Payer: Self-pay | Admitting: Dermatology

## 2012-02-23 ENCOUNTER — Emergency Department (HOSPITAL_BASED_OUTPATIENT_CLINIC_OR_DEPARTMENT_OTHER)
Admission: EM | Admit: 2012-02-23 | Discharge: 2012-02-24 | Disposition: A | Discharge status: Home / Self Care | Payer: Medicare Other | Attending: Emergency Medicine | Admitting: Emergency Medicine

## 2012-02-23 ENCOUNTER — Emergency Department (HOSPITAL_BASED_OUTPATIENT_CLINIC_OR_DEPARTMENT_OTHER): Payer: Medicare Other

## 2012-02-23 ENCOUNTER — Encounter (HOSPITAL_BASED_OUTPATIENT_CLINIC_OR_DEPARTMENT_OTHER): Payer: Self-pay | Admitting: *Deleted

## 2012-02-23 DIAGNOSIS — K429 Umbilical hernia without obstruction or gangrene: Secondary | ICD-10-CM | POA: Insufficient documentation

## 2012-02-23 DIAGNOSIS — Z941 Heart transplant status: Secondary | ICD-10-CM | POA: Insufficient documentation

## 2012-02-23 DIAGNOSIS — R109 Unspecified abdominal pain: Secondary | ICD-10-CM | POA: Insufficient documentation

## 2012-02-23 DIAGNOSIS — K56609 Unspecified intestinal obstruction, unspecified as to partial versus complete obstruction: Secondary | ICD-10-CM | POA: Insufficient documentation

## 2012-02-23 DIAGNOSIS — R609 Edema, unspecified: Secondary | ICD-10-CM | POA: Insufficient documentation

## 2012-02-23 DIAGNOSIS — I10 Essential (primary) hypertension: Secondary | ICD-10-CM | POA: Insufficient documentation

## 2012-02-23 DIAGNOSIS — E7800 Pure hypercholesterolemia, unspecified: Secondary | ICD-10-CM | POA: Insufficient documentation

## 2012-02-23 HISTORY — DX: Essential (primary) hypertension: I10

## 2012-02-23 HISTORY — DX: Heart transplant status: Z94.1

## 2012-02-23 HISTORY — DX: Gout, unspecified: M10.9

## 2012-02-23 HISTORY — DX: Pure hypercholesterolemia, unspecified: E78.00

## 2012-02-23 LAB — COMPREHENSIVE METABOLIC PANEL
AST: 30 U/L (ref 0–37)
CO2: 23 mEq/L (ref 19–32)
Calcium: 10.4 mg/dL (ref 8.4–10.5)
Creatinine, Ser: 2.2 mg/dL — ABNORMAL HIGH (ref 0.50–1.35)
GFR calc non Af Amer: 29 mL/min — ABNORMAL LOW (ref >90)
Total Protein: 7.4 g/dL (ref 6.0–8.3)

## 2012-02-23 LAB — URINALYSIS, ROUTINE W REFLEX MICROSCOPIC
Glucose, UA: NEGATIVE mg/dL
Ketones, ur: NEGATIVE mg/dL
Leukocytes, UA: NEGATIVE
pH: 6.5 (ref 5.0–8.0)

## 2012-02-23 LAB — CBC WITH DIFFERENTIAL/PLATELET
Hemoglobin: 14.6 g/dL (ref 13.0–17.0)
Lymphocytes Relative: 12 % (ref 12–46)
Lymphs Abs: 1.6 10*3/uL (ref 0.7–4.0)
MCV: 85.2 fL (ref 78.0–100.0)
Neutrophils Relative %: 76 % (ref 43–77)
Platelets: 167 10*3/uL (ref 150–400)
RBC: 4.94 MIL/uL (ref 4.22–5.81)
WBC: 13 10*3/uL — ABNORMAL HIGH (ref 4.0–10.5)

## 2012-02-23 LAB — OCCULT BLOOD X 1 CARD TO LAB, STOOL: Fecal Occult Bld: NEGATIVE

## 2012-02-23 LAB — URINE MICROSCOPIC-ADD ON

## 2012-02-23 MED ORDER — IOHEXOL 300 MG/ML  SOLN
40.0000 mL | Freq: Once | INTRAMUSCULAR | Status: AC | PRN
  Administered 2012-02-23: 40 mL via ORAL

## 2012-02-23 MED ORDER — MORPHINE SULFATE 4 MG/ML IJ SOLN
4.0000 mg | Freq: Once | INTRAMUSCULAR | Status: AC
  Administered 2012-02-23: 4 mg via INTRAVENOUS
  Filled 2012-02-23: qty 1

## 2012-02-23 MED ORDER — ONDANSETRON HCL 4 MG/2ML IJ SOLN
4.0000 mg | Freq: Once | INTRAMUSCULAR | Status: AC
  Administered 2012-02-23: 4 mg via INTRAVENOUS
  Filled 2012-02-23: qty 2

## 2012-02-23 MED ORDER — ONDANSETRON HCL 4 MG/2ML IJ SOLN
4.0000 mg | Freq: Once | INTRAMUSCULAR | Status: AC
  Administered 2012-02-23: 4 mg via INTRAVENOUS

## 2012-02-23 MED ORDER — ONDANSETRON HCL 4 MG/2ML IJ SOLN
INTRAMUSCULAR | Status: AC
  Administered 2012-02-23: 4 mg via INTRAVENOUS
  Filled 2012-02-23: qty 2

## 2012-02-23 MED ORDER — SODIUM CHLORIDE 0.9 % IV SOLN
Freq: Once | INTRAVENOUS | Status: AC
  Administered 2012-02-23: 18:00:00 via INTRAVENOUS

## 2012-02-23 NOTE — ED Notes (Signed)
Patient having abd pain, states this is not a new problem. Seen by PCP in the past multiple times. This incident started after lunch today.n

## 2012-02-23 NOTE — ED Notes (Signed)
Pt took his home meds as prescribed with EDP approval reports relief of nausea and has no episodes of emesis in several hours

## 2012-02-23 NOTE — ED Provider Notes (Signed)
History     CSN: [id number]  Arrival date & time 02/23/12  1746   First MD Initiated Contact with Patient 02/23/12 1831      Chief Complaint  Patient presents with  . Abdominal Pain    (Consider location/radiation/quality/duration/timing/severity/associated sxs/prior treatment) HPI Comments: Pt c/o rlq pain and vomiting:no fever:pt states that this has happened previously without any definite answer:pt states that he has had multiple surgeries on his abdomen for hernia  Patient is a 66 y.o. male presenting with abdominal pain. The history is provided by the patient. No language interpreter was used.  Abdominal Pain The primary symptoms of the illness include abdominal pain, nausea and vomiting. The primary symptoms of the illness do not include diarrhea or dysuria. The current episode started 3 to 5 hours ago. The onset of the illness was sudden. The problem has not changed since onset. The illness is associated with retching and eating. The patient has not had a change in bowel habit.    Past Medical History  Diagnosis Date  . Heart transplanted   . Gout   . Hypertension   . Hypercholesteremia     Past Surgical History  Procedure Date  . Cardiac surgery   . Hernia repair     No family history on file.  History  Substance Use Topics  . Smoking status: Never Smoker   . Smokeless tobacco: Not on file  . Alcohol Use: No      Review of Systems  Constitutional: Negative.   Respiratory: Negative.   Cardiovascular: Negative.   Gastrointestinal: Positive for nausea, vomiting and abdominal pain. Negative for diarrhea.  Genitourinary: Negative for dysuria.    Allergies  Review of patient's allergies indicates no known allergies.  Home Medications   Current Outpatient Rx  Name Route Sig Dispense Refill  . ALLOPURINOL 300 MG PO TABS Oral Take 300 mg by mouth daily.    . ASPIRIN 81 MG PO TABS Oral Take 81 mg by mouth daily.    Marland Kitchen CALCIUM CARBONATE-VITAMIN D 500-200  MG-UNIT PO TABS Oral Take 1 tablet by mouth daily.    Marland Kitchen CARVEDILOL 12.5 MG PO TABS Oral Take 12.5 mg by mouth 2 (two) times daily with a meal.    . CYCLOSPORINE MODIFIED (NEORAL) 100 MG PO CAPS Oral Take 100 mg by mouth 2 (two) times daily.    Marland Kitchen LISINOPRIL 10 MG PO TABS Oral Take 10 mg by mouth 2 (two) times daily.    Marland Kitchen MAGNESIUM OXIDE 400 MG PO TABS Oral Take 400 mg by mouth daily.    . ADULT MULTIVITAMIN W/MINERALS CH Oral Take 1 tablet by mouth daily.    Marland Kitchen MYCOPHENOLATE MOFETIL 500 MG PO TABS Oral Take 1,500 mg by mouth 2 (two) times daily.    . ATORVASTATIN CALCIUM 40 MG PO TABS Oral Take 40 mg by mouth daily.      BP 178/105  Pulse 92  Temp 97.8 F (36.6 C) (Oral)  Resp 16  Ht 6\' 2"  (1.88 m)  Wt 210 lb (95.255 kg)  BMI 26.96 kg/m2  SpO2 100%  Physical Exam  Nursing note and vitals reviewed. Constitutional: He is oriented to person, place, and time. He appears well-developed and well-nourished.  HENT:  Head: Normocephalic and atraumatic.  Eyes: Conjunctivae and EOM are normal.  Pulmonary/Chest: Effort normal and breath sounds normal.  Abdominal:       Pt has umbilical hernia that is reducible:pt has a large firm mass to the rlq  Genitourinary:  Stool normal color  Musculoskeletal: He exhibits edema.  Neurological: He is alert and oriented to person, place, and time.  Skin: Skin is warm and dry.  Psychiatric: He has a normal mood and affect.    ED Course  Procedures (including critical care time)  Labs Reviewed  URINALYSIS, ROUTINE W REFLEX MICROSCOPIC - Abnormal; Notable for the following:    Hgb urine dipstick SMALL (*)     Protein, ur 30 (*)     All other components within normal limits  COMPREHENSIVE METABOLIC PANEL - Abnormal; Notable for the following:    Glucose, Bld 128 (*)     BUN 52 (*)     Creatinine, Ser 2.20 (*)     GFR calc non Af Amer 29 (*)     GFR calc Af Amer 34 (*)     All other components within normal limits  LIPASE, BLOOD - Abnormal;  Notable for the following:    Lipase 110 (*)     All other components within normal limits  CBC WITH DIFFERENTIAL - Abnormal; Notable for the following:    WBC 13.0 (*)     Neutro Abs 10.0 (*)     Monocytes Absolute 1.3 (*)     All other components within normal limits  URINE MICROSCOPIC-ADD ON  OCCULT BLOOD X 1 CARD TO LAB, STOOL   Ct Abdomen Pelvis Wo Contrast  02/23/2012  *RADIOLOGY REPORT*  Clinical Data: Abdominal pain  CT ABDOMEN AND PELVIS WITHOUT CONTRAST  Technique:  Multidetector CT imaging of the abdomen and pelvis was performed following the standard protocol without intravenous contrast.  Comparison: 02/17/2008  Findings: Numerous calcified nodules are imaged and a keeping with sequelae of prior granulomas infection.  Dilated left atrium is similar to prior.  Small hiatal hernia.  Organ abnormality/lesion detection is limited in the absence of intravenous contrast. Within this limitation, nonspecific heterogeneous attenuation of the liver.  Unremarkable biliary system, spleen, pancreas, adrenal glands.  Bilateral perinephric fat stranding.  There is a 12 mm hypodensity arising from the lower pole of the right kidney, measuring water attenuation.  No hydronephrosis or hydroureter.  There is a supraumbilical fat-containing hernia.  There are 2 anterior abdominal wall hernias containing loops of small bowel. One is paraumbilical, containing decompressed small bowel loops. The second is infraumbilical/right lower quadrant and demonstrates evidence of obstruction with bowel dilatation up to 4.4 cm and fecalization of a small bowel loop.  There is adjacent fluid and mesenteric fat stranding.  No free intraperitoneal air.  Colonic diverticulosis without CT evidence for diverticulitis. Normal appendix.  No lymphadenopathy.  There is scattered atherosclerotic calcification of the aorta and its branches. No aneurysmal dilatation.  Circumaortic left renal vein.  Partially decompressed bladder.   Nonspecific prostatic calcifications and 7 mm hypoattenuation within the left aspect of the gland.  Sternal deformity is unchanged.  No acute osseous finding.  Bone islands within the right femoral head.  IMPRESSION: Three anterior abdominal wall hernias, two of which contain small bowel.  The inferior of which contains small bowel loops that appear twisted, resulting small bowel obstruction and thickened loops.  Ischemic changes not excluded.  Discussed via telephone with Dr. Jeanell Sparrow at 09:25 p.m. on 02/23/2012.  Original Report Authenticated By: Suanne Marker, M.D.     1. SBO (small bowel obstruction)       MDM  Pt bun cr at baseline:  Called [facility] at 2150:pt aware of [place] trying to get in contact with his doctor 12:27  AM Continuing to try and contact doctors at [facility] left in the care of Dr. Vernell Leep, NP 02/24/12 1340

## 2012-02-24 MED ORDER — SODIUM CHLORIDE 0.9 % IV SOLN
3.0000 g | Freq: Once | INTRAVENOUS | Status: AC
  Administered 2012-02-24: 3 g via INTRAVENOUS
  Filled 2012-02-24: qty 3

## 2012-02-24 NOTE — ED Provider Notes (Signed)
66 y.o. Male s/p heart transplant with multiple abdominal surgeries for hernia repair presents today with pain and vomiting.  Abdomen with obvious hernias both umbilical and rlq, diffusely mildly tender.  CT reviewed and patient greatly improved after ms and zofran.  Patient's care discussed and I agree with plan.  Patient seen and evaluated by me and examined.   Shaune Pollack, MD 02/24/12 [phone]

## 2012-05-27 ENCOUNTER — Other Ambulatory Visit: Payer: Self-pay | Admitting: Dermatology

## 2012-05-28 ENCOUNTER — Encounter: Payer: Self-pay | Admitting: *Deleted

## 2012-05-28 ENCOUNTER — Encounter: Payer: Self-pay | Admitting: Pulmonary Disease

## 2012-05-29 ENCOUNTER — Encounter: Payer: Self-pay | Admitting: Pulmonary Disease

## 2012-05-29 ENCOUNTER — Ambulatory Visit (INDEPENDENT_AMBULATORY_CARE_PROVIDER_SITE_OTHER): Payer: Medicare Other | Admitting: Pulmonary Disease

## 2012-05-29 ENCOUNTER — Telehealth: Payer: Self-pay | Admitting: Pulmonary Disease

## 2012-05-29 VITALS — BP 112/64 | HR 88 | Temp 98.2°F | Ht 74.0 in | Wt 213.8 lb

## 2012-05-29 DIAGNOSIS — G4733 Obstructive sleep apnea (adult) (pediatric): Secondary | ICD-10-CM

## 2012-05-29 NOTE — Patient Instructions (Addendum)
Will get you a new cpap machine, and take this opportunity to re-optimize your pressure.  Will let you know the results. Keep up with mask changes and supplies. Work on modest weight loss followup with me in one year.

## 2012-05-29 NOTE — Assessment & Plan Note (Signed)
The patient has a history of obstructive sleep apnea, and has been on CPAP for many years.  His machine recently quit working, and he needs to have a replacement as well as a new mask and supplies.  The patient feels that he was doing well with CPAP prior to the malfunction.  At this point, I will give him a new CPAP machine, but will also take this opportunity to optimize his pressure again.  I have also encouraged him to work on modest weight loss, and to keep up with mask changes in supplies over the years.

## 2012-05-29 NOTE — Progress Notes (Signed)
  Subjective:    Patient ID: Brian Suarez, male    DOB: 1945-10-14, 66 y.o.   MRN: [id number]  HPI The patient is a 66 year old male who I been asked to see for management of obstructive sleep apnea.  He was diagnosed with mild sleep apnea in 2005, with an AHI of 13 events per hour.  He was started on CPAP, and has done very well with the treatment since that time.  He feels like he sleeps well, and has very adequate daytime alertness.  Most recently, the patient's machine quit functioning, and he is currently using a loaner.  He does have an aged nasal CPAP mask.  The patient states that his weight is up only 5 pounds over the last few years, and his Epworth score today is normal at 4.  Sleep Questionnaire: What time do you typically go to bed?( Between what hours) 11 pm How long does it take you to fall asleep? 15 mins How many times during the night do you wake up? 2 What time do you get out of bed to start your day? 0700 Do you drive or operate heavy machinery in your occupation? No How much has your weight changed (up or down) over the past two years? (In pounds) 5 lb (2.268 kg) Have you ever had a sleep study before? Yes If yes, location of study? [place] [facility] If yes, date of study? 2002 Do you currently use CPAP? Yes If so, what pressure? 7 Do you wear oxygen at any time? No    Review of Systems  Constitutional: Negative for fever and unexpected weight change.  HENT: Negative for ear pain, nosebleeds, congestion, sore throat, rhinorrhea, sneezing, trouble swallowing, dental problem, postnasal drip and sinus pressure.   Eyes: Negative for redness and itching.  Respiratory: Positive for shortness of breath ( upon activity ). Negative for cough, chest tightness and wheezing.   Cardiovascular: Negative for palpitations and leg swelling.  Gastrointestinal: Negative for nausea and vomiting.  Genitourinary: Negative for dysuria.  Musculoskeletal: Negative for joint swelling.  Skin:  Negative for rash.  Neurological: Negative for headaches.  Hematological: Bruises/bleeds easily.  Psychiatric/Behavioral: Negative for dysphoric mood. The patient is not nervous/anxious.        Objective:   Physical Exam Constitutional:  Well developed, no acute distress  HENT:  Nares patent without discharge, mild septal deviation to the left.   Oropharynx without exudate, palate and uvula are elongated.  Eyes:  Perrla, eomi, no scleral icterus  Neck:  No JVD, no TMG  Cardiovascular:  Normal rate, regular rhythm, no rubs or gallops.  No murmurs        Intact distal pulses  Pulmonary :  Normal breath sounds, no stridor or respiratory distress   No rales, rhonchi, or wheezing  Abdominal:  Soft, nondistended, bowel sounds present.  No tenderness noted.   Musculoskeletal:  minimal lower extremity edema noted.  Lymph Nodes:  No cervical lymphadenopathy noted  Skin:  No cyanosis noted  Neurologic:  Alert, appropriate, moves all 4 extremities without obvious deficit.         Assessment & Plan:

## 2012-05-30 ENCOUNTER — Other Ambulatory Visit: Payer: Self-pay | Admitting: Pulmonary Disease

## 2012-05-30 DIAGNOSIS — G4733 Obstructive sleep apnea (adult) (pediatric): Secondary | ICD-10-CM

## 2012-05-30 NOTE — Telephone Encounter (Signed)
Order sent to pcc.

## 2012-05-30 NOTE — Telephone Encounter (Signed)
I spoke with the pt and he is aware of the situation regarding the old sleep study. He has verbalized understanding that in order for [facility] to pay for a new cpap machine he must have another sleep test performed. The pt stated he is willing to have a new test done at the sleep center. Pt also aware the PCC's will call him with the date and time of the test. He does not want to do the test on a Friday or Saturday night but any other night is fine. Dr. Gwenette Greet, pls advise on what specific test you want performed.

## 2012-06-23 ENCOUNTER — Ambulatory Visit (HOSPITAL_BASED_OUTPATIENT_CLINIC_OR_DEPARTMENT_OTHER): Payer: Medicare Other | Attending: Pulmonary Disease

## 2012-06-23 VITALS — Ht 74.0 in | Wt 210.0 lb

## 2012-06-23 DIAGNOSIS — G4733 Obstructive sleep apnea (adult) (pediatric): Secondary | ICD-10-CM | POA: Insufficient documentation

## 2012-07-08 DIAGNOSIS — G4710 Hypersomnia, unspecified: Secondary | ICD-10-CM

## 2012-07-08 DIAGNOSIS — G4730 Sleep apnea, unspecified: Secondary | ICD-10-CM

## 2012-07-08 NOTE — Procedures (Signed)
NAME:  Brian Suarez, Brian Suarez NO.:  [id number]  MEDICAL RECORD NO.:  [id number]          PATIENT TYPE:  OUT  LOCATION:  SLEEP CENTER                 FACILITY:  [facility]  PHYSICIAN:  Kathee Delton, MD,FCCPDATE OF BIRTH:  09-14[birth year]1945  DATE OF STUDY:  06/23/2012                           NOCTURNAL POLYSOMNOGRAM  REFERRING PHYSICIAN:  Kathee Delton, MD,FCCP  LOCATION:  Sleep Lab.  INDICATION FOR STUDY:  Hypersomnia with sleep apnea.  EPWORTH SLEEPINESS SCORE:  1.  MEDICATIONS:  SLEEP ARCHITECTURE:  The patient had a total sleep time of only 56 minutes with no slow-wave sleep or REM noted.  Sleep onset latency was prolonged at 63 minutes and sleep efficiency was very poor at 15%.  RESPIRATORY DATA:  The patient was found to have 12 apneas that were primarily central in nature and 22 obstructive hypopneas, giving him an apnea-hypopnea index of 37 events per hour.  The events occurred in all body positions and there was moderate snoring noted throughout.  The patient did not meet split night criteria because of his short total sleep time.  OXYGEN DATA:  There was O2 desaturation as low as 85% with the patient's obstructive events.  CARDIAC DATA:  Rare PAC noted, but no clinically significant arrhythmias were seen.  MOVEMENT-PARASOMNIA:  The patient had no significant leg jerks or other abnormal behaviors noted.  IMPRESSIONS-RECOMMENDATIONS: 1. Severe obstructive sleep apnea/hypopnea syndrome with an AHI of 37     events per hour and oxygen desaturation as low as 85%.  Treatment     for this degree of sleep apnea should focus primarily on CPAP as     well     as modest weight loss. 2. Rare premature atrial contraction noted, but no clinically     significant arrhythmias were seen.     Kathee Delton, MD,FCCP Diplomate, [place] Board of Sleep Medicine    [facility]/MEDQ  D:  07/08/2012 07:57:56  T:  07/08/2012 22:48:34  Job:  [id number]

## 2012-07-09 ENCOUNTER — Ambulatory Visit (INDEPENDENT_AMBULATORY_CARE_PROVIDER_SITE_OTHER): Payer: Medicare Other | Admitting: Gastroenterology

## 2012-07-09 ENCOUNTER — Encounter: Payer: Self-pay | Admitting: Gastroenterology

## 2012-07-09 VITALS — BP 144/98 | HR 80 | Ht 74.0 in | Wt 214.6 lb

## 2012-07-09 DIAGNOSIS — K469 Unspecified abdominal hernia without obstruction or gangrene: Secondary | ICD-10-CM

## 2012-07-09 NOTE — Patient Instructions (Addendum)
You should only have surgery on your abdominal wall hernia if it becomes incarcerated and cannot be reduced. Call if you have any questions or concerns.

## 2012-07-09 NOTE — Progress Notes (Signed)
Review of pertinent gastrointestinal problems: 1. Routine risk for colon cancers: colonsocopy 5/11 ([facility]) found diverticuolosis only, no polyps; recall at 10 years recommended.   HPI: This is a     very pleasant 66 year old man who is here with his wife today.  Pains in stomach, often post prandial.  Would lay down and the pain improves. Sometime will have to vomit and pain goes away.  Had SMO related to abd wall henias proven by CT this past summer, went to [place], hernia reduced manually and he felt better.  He can feel a knot in hernia at times.  Has had hernia repaired in past:  Twice, both at [facility], most recently 2006.  It sounds as if he gets some relief, temporary.  Wears a binder.  He is NOT bothered daily with any discomforts. He is ONLY bothered by acute attacks of this.  He understands how to massage it back.    Review of systems: Pertinent positive and negative review of systems were noted in the above HPI section. Complete review of systems was performed and was otherwise normal.    Past Medical History  Diagnosis Date  . Heart transplanted   . Gout   . Hypertension   . Hypercholesteremia   . Chronic renal insufficiency   . OSA (obstructive sleep apnea)     Past Surgical History  Procedure Date  . Cardiac surgery 2005    heart cath  . Hernia repair   . Heart transplant 2002  . Endomyocardial biopsy 2005  . Cystectomy     EPIDERMOID  . Pericardium surgery 2002    BX   . Pericardiocentesis 2010    Current Outpatient Prescriptions  Medication Sig Dispense Refill  . allopurinol (ZYLOPRIM) 300 MG tablet Take 300 mg by mouth daily.      Marland Kitchen amoxicillin (AMOXIL) 500 MG capsule Take 500 mg by mouth 3 (three) times daily. Prn dentist      . aspirin 81 MG tablet Take 81 mg by mouth daily.      Marland Kitchen atorvastatin (LIPITOR) 40 MG tablet Take 40 mg by mouth daily.      . calcium-vitamin D (OSCAL WITH D) 500-200 MG-UNIT per tablet Take 1 tablet by mouth daily.      .  carvedilol (COREG) 12.5 MG tablet Take 12.5 mg by mouth 2 (two) times daily with a meal.      . colchicine 0.6 MG tablet Take 0.6 mg by mouth as needed.      . cycloSPORINE modified (GENGRAF) 100 MG capsule Take 100 mg by mouth 2 (two) times daily.      . fish oil-omega-3 fatty acids 1000 MG capsule Take 2 g by mouth daily.      Marland Kitchen lisinopril (PRINIVIL,ZESTRIL) 10 MG tablet Take 10 mg by mouth 2 (two) times daily.      . magnesium oxide (MAG-OX) 400 MG tablet Take 400 mg by mouth daily.      . Multiple Vitamin (MULTIVITAMIN WITH MINERALS) TABS Take 1 tablet by mouth daily.      . mycophenolate (CELLCEPT) 500 MG tablet Take 1,500 mg by mouth 2 (two) times daily.      . ondansetron (ZOFRAN-ODT) 4 MG disintegrating tablet       . predniSONE (DELTASONE) 2.5 MG tablet Take 2.5 mg by mouth daily.        Allergies as of 07/09/2012 - Review Complete 07/09/2012  Allergen Reaction Noted  . Acyclovir and related  05/28/2012  . Rapamune (sirolimus)  05/28/2012  . Verapamil  05/28/2012    Family History  Problem Relation Age of Onset  . Throat cancer Father   . Dementia Mother   . Other Mother     HYPOTENSTION    History   Social History  . Marital Status: Married    Spouse Name: N/A    Number of Children: N/A  . Years of Education: N/A   Occupational History  . Not on file.   Social History Main Topics  . Smoking status: Never Smoker   . Smokeless tobacco: Never Used  . Alcohol Use: No  . Drug Use: No  . Sexually Active: Not on file   Other Topics Concern  . Not on file   Social History Narrative  . No narrative on file       Physical Exam: BP 144/98  Pulse 80  Ht 6\' 2"  (1.88 m)  Wt 214 lb 9.6 oz (97.342 kg)  BMI 27.55 kg/m2 Constitutional: generally well-appearing Psychiatric: alert and oriented x3 Eyes: extraocular movements intact Mouth: oral pharynx moist, no lesions Neck: supple no lymphadenopathy Cardiovascular: heart regular rate and rhythm Lungs: clear to  auscultation bilaterally Abdomen: soft, nontender, nondistended, no obvious ascites, no peritoneal signs, normal bowel sounds: Large abdominal wall midline defect, hernia. This is nontender. Valsalva produces quite a large protuberant bulging in his abdomen that is not tender or painful.  Extremities: no lower extremity edema bilaterally Skin: no lesions on visible extremities    Assessment and plan: 66 y.o. male with  significant abdominal wall hernia  He has had 2 abdominal wall hernia repair surgeries at [facility] and these have obviously not fixed his situation. Last summer he had an incarcerated abdominal wall hernia and was transferred to [facility] and fortunately the hernia was able to be reduced non-operatively by a surgeon. Between these episodes which do not occur very often he feels absolutely fine. He is only bothered by the cosmetic appearance and sometimes loud rumbling through the hernia. I explained to him that his hernia should only be attempted to be fixed if it becomes incarcerated and cannot be reduced. Elective repair of his hernia would probably be unsuccessful as it has been unsuccessful twice in the past.

## 2012-07-11 ENCOUNTER — Telehealth: Payer: Self-pay | Admitting: Pulmonary Disease

## 2012-07-11 NOTE — Telephone Encounter (Signed)
Dr. Gwenette Greet, pls advise.  Thank you.

## 2012-07-11 NOTE — Telephone Encounter (Signed)
Will check with [facility]. Waiting on Sharon/Millie to return my call. Rhonda J Cobb

## 2012-07-11 NOTE — Telephone Encounter (Signed)
Brian Suarez, will you pls help with this?  Thank you.

## 2012-07-11 NOTE — Telephone Encounter (Signed)
Spoke with patient and he is aware that sleep study has re-verified his sleep apnea. Order has been faxed along with sleep study to [facility] to set him up with s9 escape/auto on the auto mode.  [facility] will contact patient on mobile number to arrange new cpap set up. Rhonda J Cobb

## 2012-07-11 NOTE — Telephone Encounter (Signed)
Does patient need a return appointment for compliance after set up within 31 to 90 days? Please advise.Brian Suarez

## 2012-07-11 NOTE — Telephone Encounter (Signed)
Per Ivin Booty at [facility] no follow is required other than yearly ov. Brian Suarez

## 2012-07-11 NOTE — Telephone Encounter (Signed)
Please let pt know that his sleep study re-verified his sleep apnea, and we need to go ahead and get his new machine as ordered with auto for re-optimization of his pressure.

## 2012-07-11 NOTE — Telephone Encounter (Signed)
He has been on cpap for years and doing well.  We repeated his study only because his machine quit working and DME demanded that he have a new study.  I would not think he needs ov when I just saw him and has been on cpap successfully for years with no issues???  Will have to check with [facility].   If he does have to be seen, it will be at no charge to the pt.

## 2012-07-21 ENCOUNTER — Telehealth: Payer: Self-pay | Admitting: Pulmonary Disease

## 2012-07-21 NOTE — Telephone Encounter (Signed)
Faxed copy of electronically signed npsg to [email or web address]  Brian Suarez

## 2012-11-25 ENCOUNTER — Other Ambulatory Visit: Payer: Self-pay | Admitting: Dermatology

## 2013-02-17 ENCOUNTER — Emergency Department (HOSPITAL_COMMUNITY)
Admission: EM | Admit: 2013-02-17 | Discharge: 2013-02-17 | Disposition: A | Discharge status: Home / Self Care | Payer: PRIVATE HEALTH INSURANCE | Attending: Emergency Medicine | Admitting: Emergency Medicine

## 2013-02-17 DIAGNOSIS — Z9861 Coronary angioplasty status: Secondary | ICD-10-CM | POA: Insufficient documentation

## 2013-02-17 DIAGNOSIS — IMO0002 Reserved for concepts with insufficient information to code with codable children: Secondary | ICD-10-CM | POA: Insufficient documentation

## 2013-02-17 DIAGNOSIS — R1110 Vomiting, unspecified: Secondary | ICD-10-CM | POA: Insufficient documentation

## 2013-02-17 DIAGNOSIS — I129 Hypertensive chronic kidney disease with stage 1 through stage 4 chronic kidney disease, or unspecified chronic kidney disease: Secondary | ICD-10-CM | POA: Insufficient documentation

## 2013-02-17 DIAGNOSIS — K439 Ventral hernia without obstruction or gangrene: Secondary | ICD-10-CM | POA: Insufficient documentation

## 2013-02-17 DIAGNOSIS — N189 Chronic kidney disease, unspecified: Secondary | ICD-10-CM | POA: Insufficient documentation

## 2013-02-17 DIAGNOSIS — Z941 Heart transplant status: Secondary | ICD-10-CM | POA: Insufficient documentation

## 2013-02-17 DIAGNOSIS — Z79899 Other long term (current) drug therapy: Secondary | ICD-10-CM | POA: Insufficient documentation

## 2013-02-17 DIAGNOSIS — Z862 Personal history of diseases of the blood and blood-forming organs and certain disorders involving the immune mechanism: Secondary | ICD-10-CM | POA: Insufficient documentation

## 2013-02-17 DIAGNOSIS — M109 Gout, unspecified: Secondary | ICD-10-CM | POA: Insufficient documentation

## 2013-02-17 DIAGNOSIS — Z7982 Long term (current) use of aspirin: Secondary | ICD-10-CM | POA: Insufficient documentation

## 2013-02-17 DIAGNOSIS — Z8669 Personal history of other diseases of the nervous system and sense organs: Secondary | ICD-10-CM | POA: Insufficient documentation

## 2013-02-17 DIAGNOSIS — Z8639 Personal history of other endocrine, nutritional and metabolic disease: Secondary | ICD-10-CM | POA: Insufficient documentation

## 2013-02-17 MED ORDER — ONDANSETRON HCL 4 MG/2ML IJ SOLN
4.0000 mg | Freq: Once | INTRAMUSCULAR | Status: AC
  Administered 2013-02-17: 4 mg via INTRAVENOUS
  Filled 2013-02-17: qty 2

## 2013-02-17 MED ORDER — HYDROMORPHONE HCL PF 1 MG/ML IJ SOLN
1.0000 mg | Freq: Once | INTRAMUSCULAR | Status: AC
  Administered 2013-02-17: 1 mg via INTRAVENOUS
  Filled 2013-02-17: qty 1

## 2013-02-17 NOTE — ED Notes (Signed)
[id number] Expected date:02/17/13<BR> Expected time:12:52 AM<BR> Means of arrival:Ambulance<BR> Comments:<BR> Vomiting/hernia

## 2013-02-17 NOTE — ED Provider Notes (Addendum)
History    CSN: [id number] Arrival date & time 02/17/13  0103  First MD Initiated Contact with Patient 02/17/13 0151     Chief Complaint  Patient presents with  . Hernia  . Emesis   (Consider location/radiation/quality/duration/timing/severity/associated sxs/prior Treatment) HPI 67 year old male presents to emergency room with complaint of right abdominal hernia that is painful and hard and unable to be reduced.  Patient has had profuse vomiting.  Hernia has been out for the last several hours.  This is happened in the past, and family usually is able to massage it back into place.  He has required reduction under conscious sedation at [facility].  Patient has had previous hernia repairs that have failed.  He has been advised due to his complicated past medical history to not have surgery unless absolutely necessary.  Past Medical History  Diagnosis Date  . Heart transplanted   . Gout   . Hypertension   . Hypercholesteremia   . Chronic renal insufficiency   . OSA (obstructive sleep apnea)    Past Surgical History  Procedure Laterality Date  . Cardiac surgery  2005    heart cath  . Hernia repair    . Heart transplant  2002  . Endomyocardial biopsy  2005  . Cystectomy      EPIDERMOID  . Pericardium surgery  2002    BX   . Pericardiocentesis  2010   Family History  Problem Relation Age of Onset  . Throat cancer Father   . Dementia Mother   . Other Mother     HYPOTENSTION   History  Substance Use Topics  . Smoking status: Never Smoker   . Smokeless tobacco: Never Used  . Alcohol Use: No    Review of Systems  All other systems reviewed and are negative.    Allergies  Acyclovir and related; Rapamune; and Verapamil  Home Medications   Current Outpatient Rx  Name  Route  Sig  Dispense  Refill  . allopurinol (ZYLOPRIM) 300 MG tablet   Oral   Take 300 mg by mouth daily.         Marland Kitchen aspirin 81 MG tablet   Oral   Take 81 mg by mouth daily.         Marland Kitchen  atorvastatin (LIPITOR) 40 MG tablet   Oral   Take 40 mg by mouth daily.         . calcium-vitamin D (OSCAL WITH D) 500-200 MG-UNIT per tablet   Oral   Take 1 tablet by mouth daily.         . carvedilol (COREG) 12.5 MG tablet   Oral   Take 12.5 mg by mouth 2 (two) times daily with a meal.         . colchicine 0.6 MG tablet   Oral   Take 0.6 mg by mouth as needed.         . cycloSPORINE modified (GENGRAF) 100 MG capsule   Oral   Take 100 mg by mouth 2 (two) times daily.         . fish oil-omega-3 fatty acids 1000 MG capsule   Oral   Take 2 g by mouth daily.         Marland Kitchen lisinopril (PRINIVIL,ZESTRIL) 10 MG tablet   Oral   Take 10 mg by mouth 2 (two) times daily.         . Multiple Vitamin (MULTIVITAMIN WITH MINERALS) TABS   Oral   Take 1 tablet by  mouth daily.         . mycophenolate (CELLCEPT) 500 MG tablet   Oral   Take 1,500 mg by mouth 2 (two) times daily.         Marland Kitchen amoxicillin (AMOXIL) 500 MG capsule   Oral   Take 500 mg by mouth 3 (three) times daily. Prn dentist         . magnesium oxide (MAG-OX) 400 MG tablet   Oral   Take 400 mg by mouth daily.         . ondansetron (ZOFRAN-ODT) 4 MG disintegrating tablet               . predniSONE (DELTASONE) 2.5 MG tablet   Oral   Take 2.5 mg by mouth daily.          BP 141/77  Pulse 93  Temp(Src) 97.9 F (36.6 C) (Oral)  Resp 17  SpO2 98% Physical Exam  Nursing note and vitals reviewed. Constitutional: He appears well-developed and well-nourished. He appears distressed.  HENT:  Head: Normocephalic and atraumatic.  Mouth/Throat: Oropharynx is clear and moist.  Eyes: Conjunctivae and EOM are normal. Pupils are equal, round, and reactive to light.  Neck: Normal range of motion. Neck supple. No JVD present. No tracheal deviation present. No thyromegaly present.  Cardiovascular: Normal rate, regular rhythm, normal heart sounds and intact distal pulses.  Exam reveals no gallop and no  friction rub.   No murmur heard. Pulmonary/Chest: Effort normal and breath sounds normal. No stridor. No respiratory distress. He has no wheezes. He has no rales. He exhibits no tenderness.  Abdominal: Bowel sounds are normal. He exhibits distension and mass (Bulging noted to right lower abdomen, firm, irreducible Patient with second hernia at umbilicus large, but reducible.). There is tenderness. There is no rebound and no guarding.  Musculoskeletal: Normal range of motion. He exhibits no edema and no tenderness.  Lymphadenopathy:    He has no cervical adenopathy.  Skin: Skin is warm and dry. No rash noted. No erythema. No pallor.    ED Course  Hernia reduction Date/Time: 02/17/2013 8:14 AM Performed by: Kalman Drape Authorized by: Kalman Drape Consent: Verbal consent obtained. Risks and benefits: risks, benefits and alternatives were discussed Consent given by: patient Local anesthesia used: no Patient sedated: no Patient tolerance: Patient tolerated the procedure well with no immediate complications. Comments: Using slow constant pressure to the firm mass in the right lower abdomen, over a period of 15-20 minutes, I was able to reduce the hernia.   (including critical care time)    Date: 02/17/2013  Rate: 95  Rhythm: normal sinus rhythm  QRS Axis: normal  Intervals: normal  ST/T Wave abnormalities: normal  Conduction Disutrbances:incomplete RBBB  Narrative Interpretation:   Old EKG Reviewed: unchanged   Labs Reviewed - No data to display No results found. 1. Hernia of abdominal wall     MDM  67 year old male with hernia that was able to be reduced with some difficulty.  Patient advised to stick to a clear liquid diet and advance slowly as tolerated and followup with his doctors as scheduled.  Kalman Drape, MD 02/17/13 [id number]  Kalman Drape, MD 02/17/13 [phone]

## 2013-02-17 NOTE — ED Notes (Signed)
Pt has preexisting abdominal hernia that has now protruded more than normal. Copious vomitng. Pain.

## 2013-05-26 ENCOUNTER — Other Ambulatory Visit: Payer: Self-pay | Admitting: Dermatology

## 2013-05-29 ENCOUNTER — Ambulatory Visit (INDEPENDENT_AMBULATORY_CARE_PROVIDER_SITE_OTHER): Payer: Medicare Other | Admitting: Pulmonary Disease

## 2013-05-29 ENCOUNTER — Encounter: Payer: Self-pay | Admitting: Pulmonary Disease

## 2013-05-29 VITALS — BP 120/86 | HR 86 | Temp 97.9°F | Ht 74.0 in | Wt 229.0 lb

## 2013-05-29 DIAGNOSIS — G4733 Obstructive sleep apnea (adult) (pediatric): Secondary | ICD-10-CM

## 2013-05-29 NOTE — Assessment & Plan Note (Signed)
The patient is on the automatic setting for his CPAP, and is doing well.  He is satisfied with his quality of sleep, and also his daytime alertness.  I've encouraged him to work aggressively on weight loss, and also to keep up with his mask changes and supplies.

## 2013-05-29 NOTE — Patient Instructions (Addendum)
Continue with cpap, and keep up with mask changes and supplies. Work on weight reduction. followup with me in one year if doing well.

## 2013-05-29 NOTE — Progress Notes (Signed)
  Subjective:    Patient ID: Brian Suarez, male    DOB: 10-Jun-1946, 67 y.o.   MRN: [id number]  HPI The patient comes in today for followup of his obstructive sleep apnea.  He is using CPAP on the automatic sitting, and feels that he is doing well with the device.  He is satisfied with his sleep and daytime alertness, and is having no issues with his mask fit or pressure.  His weight is up 16 pounds since the last visit.   Review of Systems  Constitutional: Negative for fever and unexpected weight change.  HENT: Negative for ear pain, nosebleeds, congestion, sore throat, rhinorrhea, sneezing, trouble swallowing, dental problem, postnasal drip and sinus pressure.   Eyes: Negative for redness and itching.  Respiratory: Negative for cough, chest tightness, shortness of breath and wheezing.   Cardiovascular: Negative for palpitations and leg swelling.  Gastrointestinal: Negative for nausea and vomiting.  Genitourinary: Negative for dysuria.  Musculoskeletal: Negative for joint swelling.  Skin: Negative for rash.  Neurological: Negative for headaches.  Hematological: Does not bruise/bleed easily.  Psychiatric/Behavioral: Negative for dysphoric mood. The patient is not nervous/anxious.        Objective:   Physical Exam Overweight male in no acute distress Nose without purulence or discharge noted No skin breakdown or pressure necrosis from the CPAP Neck without lymphadenopathy or thyromegaly Lower extremities with mild edema, no cyanosis Alert and oriented, moves all 4 extremities.       Assessment & Plan:

## 2013-11-24 ENCOUNTER — Other Ambulatory Visit: Payer: Self-pay | Admitting: Dermatology

## 2014-06-02 ENCOUNTER — Ambulatory Visit (INDEPENDENT_AMBULATORY_CARE_PROVIDER_SITE_OTHER): Payer: Medicare Other | Admitting: Pulmonary Disease

## 2014-06-02 ENCOUNTER — Encounter (INDEPENDENT_AMBULATORY_CARE_PROVIDER_SITE_OTHER): Payer: Self-pay

## 2014-06-02 ENCOUNTER — Encounter: Payer: Self-pay | Admitting: Pulmonary Disease

## 2014-06-02 VITALS — BP 146/88 | HR 85 | Temp 98.0°F | Ht 74.0 in | Wt 224.6 lb

## 2014-06-02 DIAGNOSIS — G4733 Obstructive sleep apnea (adult) (pediatric): Secondary | ICD-10-CM

## 2014-06-02 NOTE — Progress Notes (Signed)
   Subjective:    Patient ID: Brian Suarez, male    DOB: [birth year], 68 y.o.   MRN: [id number]  HPI The patient comes in today for followup of his obstructive sleep apnea. He is wearing CPAP compliantly by his history, but feels that he is having some breakthrough snoring and possibly breakthrough apnea. He feels that he is not resting quite as well, but admits that he can awaken and not get back to sleep for a few hours. His machine is only 37-83 years old, and his weight is actually down 5 pounds from the last visit. He has not been keeping up with his mask cushion changes as frequently as he needs to.   Review of Systems  Constitutional: Negative for fever and unexpected weight change.  HENT: Negative for congestion, dental problem, ear pain, nosebleeds, postnasal drip, rhinorrhea, sinus pressure, sneezing, sore throat and trouble swallowing.   Eyes: Negative for redness and itching.  Respiratory: Negative for cough, chest tightness, shortness of breath and wheezing.   Cardiovascular: Negative for palpitations and leg swelling.  Gastrointestinal: Negative for nausea and vomiting.  Genitourinary: Negative for dysuria.  Musculoskeletal: Negative for joint swelling.  Skin: Negative for rash.  Neurological: Negative for headaches.  Hematological: Does not bruise/bleed easily.  Psychiatric/Behavioral: Negative for dysphoric mood. The patient is not nervous/anxious.        Objective:   Physical Exam Overweight male in no acute distress Nose without purulence or discharge noted Neck without lymphadenopathy or thyromegaly No skin breakdown or pressure necrosis from the CPAP mask Lower extremities without significant edema, no cyanosis Alert and oriented, moves all 4 extremities.       Assessment & Plan:

## 2014-06-02 NOTE — Assessment & Plan Note (Signed)
The patient has been wearing CPAP compliantly, but feels he may be having great through of events and is not as rested. Will have to trouble shoot the various issues, but I do think he needs to change his mask cushions more frequently in order to eliminate all chances of leak. It is unlikely that his pressure needs have increased with recent weight loss, and his machine is only 26-68 years old and therefore likely to be functioning properly. I will get a download off his device for the last few months to try and see if there is an ongoing issue. I have also encouraged him to work aggressively on weight loss.

## 2014-06-02 NOTE — Patient Instructions (Signed)
We will need to get a download off your machine.  Please call us if you have not heard from [place] within one week after your homecare company gets your download.  You need to change out the cushion on your mask about every 2-3 mos. Keep working on weight loss followup with me again in one year if doing well, but call if you feel your issues are continuing.

## 2014-06-09 ENCOUNTER — Telehealth: Payer: Self-pay | Admitting: Pulmonary Disease

## 2014-06-09 ENCOUNTER — Encounter (HOSPITAL_COMMUNITY): Payer: Self-pay | Admitting: Emergency Medicine

## 2014-06-09 ENCOUNTER — Emergency Department (HOSPITAL_COMMUNITY)
Admission: EM | Admit: 2014-06-09 | Discharge: 2014-06-09 | Disposition: A | Discharge status: Home / Self Care | Payer: Medicare Other | Attending: Emergency Medicine | Admitting: Emergency Medicine

## 2014-06-09 DIAGNOSIS — N189 Chronic kidney disease, unspecified: Secondary | ICD-10-CM | POA: Insufficient documentation

## 2014-06-09 DIAGNOSIS — I129 Hypertensive chronic kidney disease with stage 1 through stage 4 chronic kidney disease, or unspecified chronic kidney disease: Secondary | ICD-10-CM | POA: Insufficient documentation

## 2014-06-09 DIAGNOSIS — Z941 Heart transplant status: Secondary | ICD-10-CM | POA: Diagnosis not present

## 2014-06-09 DIAGNOSIS — K439 Ventral hernia without obstruction or gangrene: Secondary | ICD-10-CM | POA: Diagnosis not present

## 2014-06-09 DIAGNOSIS — E785 Hyperlipidemia, unspecified: Secondary | ICD-10-CM | POA: Diagnosis not present

## 2014-06-09 DIAGNOSIS — M109 Gout, unspecified: Secondary | ICD-10-CM | POA: Insufficient documentation

## 2014-06-09 DIAGNOSIS — Z79899 Other long term (current) drug therapy: Secondary | ICD-10-CM | POA: Diagnosis not present

## 2014-06-09 DIAGNOSIS — Z792 Long term (current) use of antibiotics: Secondary | ICD-10-CM | POA: Diagnosis not present

## 2014-06-09 DIAGNOSIS — Z7982 Long term (current) use of aspirin: Secondary | ICD-10-CM | POA: Insufficient documentation

## 2014-06-09 DIAGNOSIS — Z8669 Personal history of other diseases of the nervous system and sense organs: Secondary | ICD-10-CM | POA: Insufficient documentation

## 2014-06-09 DIAGNOSIS — R1031 Right lower quadrant pain: Secondary | ICD-10-CM | POA: Diagnosis present

## 2014-06-09 LAB — CBC WITH DIFFERENTIAL/PLATELET
Basophils Absolute: 0 10*3/uL (ref 0.0–0.1)
Basophils Relative: 0 % (ref 0–1)
EOS ABS: 0 10*3/uL (ref 0.0–0.7)
EOS PCT: 0 % (ref 0–5)
HEMATOCRIT: 42.3 % (ref 39.0–52.0)
HEMOGLOBIN: 14.4 g/dL (ref 13.0–17.0)
LYMPHS ABS: 0.9 10*3/uL (ref 0.7–4.0)
LYMPHS PCT: 9 % — AB (ref 12–46)
MCH: 29.5 pg (ref 26.0–34.0)
MCHC: 34 g/dL (ref 30.0–36.0)
MCV: 86.7 fL (ref 78.0–100.0)
MONO ABS: 0.5 10*3/uL (ref 0.1–1.0)
MONOS PCT: 6 % (ref 3–12)
Neutro Abs: 8 10*3/uL — ABNORMAL HIGH (ref 1.7–7.7)
Neutrophils Relative %: 85 % — ABNORMAL HIGH (ref 43–77)
Platelets: 136 10*3/uL — ABNORMAL LOW (ref 150–400)
RBC: 4.88 MIL/uL (ref 4.22–5.81)
RDW: 13.6 % (ref 11.5–15.5)
WBC: 9.5 10*3/uL (ref 4.0–10.5)

## 2014-06-09 LAB — COMPREHENSIVE METABOLIC PANEL
ALT: 33 U/L (ref 0–53)
ANION GAP: 17 — AB (ref 5–15)
AST: 30 U/L (ref 0–37)
Albumin: 4.3 g/dL (ref 3.5–5.2)
Alkaline Phosphatase: 130 U/L — ABNORMAL HIGH (ref 39–117)
BUN: 45 mg/dL — AB (ref 6–23)
CALCIUM: 10.2 mg/dL (ref 8.4–10.5)
CO2: 22 meq/L (ref 19–32)
Chloride: 101 mEq/L (ref 96–112)
Creatinine, Ser: 2.05 mg/dL — ABNORMAL HIGH (ref 0.50–1.35)
GFR, EST AFRICAN AMERICAN: 37 mL/min — AB (ref >90)
GFR, EST NON AFRICAN AMERICAN: 32 mL/min — AB (ref >90)
GLUCOSE: 130 mg/dL — AB (ref 70–99)
Potassium: 5.1 mEq/L (ref 3.7–5.3)
SODIUM: 140 meq/L (ref 137–147)
TOTAL PROTEIN: 7.6 g/dL (ref 6.0–8.3)
Total Bilirubin: 2.2 mg/dL — ABNORMAL HIGH (ref 0.3–1.2)

## 2014-06-09 MED ORDER — ONDANSETRON HCL 4 MG/2ML IJ SOLN
4.0000 mg | Freq: Once | INTRAMUSCULAR | Status: AC
  Administered 2014-06-09: 4 mg via INTRAVENOUS
  Filled 2014-06-09: qty 2

## 2014-06-09 MED ORDER — MORPHINE SULFATE 4 MG/ML IJ SOLN
6.0000 mg | Freq: Once | INTRAMUSCULAR | Status: AC
  Administered 2014-06-09: 6 mg via INTRAVENOUS
  Filled 2014-06-09: qty 2

## 2014-06-09 MED ORDER — HYDROCODONE-ACETAMINOPHEN 5-325 MG PO TABS: 1.0000 | ORAL_TABLET | Freq: Four times a day (QID) | ORAL | Status: DC | PRN

## 2014-06-09 MED ORDER — ONDANSETRON 4 MG PO TBDP: 4.0000 mg | ORAL_TABLET | Freq: Three times a day (TID) | ORAL | Status: DC | PRN

## 2014-06-09 NOTE — ED Notes (Signed)
Per EMS- Patient c/o RLQ pain. Patient has an existing hernia. Patient reports that his wife is usually able to reduce the hernia when needed, but today she could not do so. Patient c/o 4/10 pain Patient denies an nausea/vomiting.

## 2014-06-09 NOTE — Telephone Encounter (Signed)
Please let pt know that I have reviewed his download for the last 90 days, and it shows fantastic control of his sleep apnea.  Machine appears to be doing its job.

## 2014-06-09 NOTE — ED Notes (Signed)
Bed: [id number] Expected date:  Expected time:  Means of arrival:  Comments: abd pain, unable to reduce hernia

## 2014-06-09 NOTE — ED Provider Notes (Signed)
TIME SEEN: 12:11 PM  CHIEF COMPLAINT: right abdominal hernia, vomiting  HPI: Pt is a 68 y.o. M with h/o heart transplant on cyclosporine and cellcept, HTN, CKD, HLD who presents to ED with recurrent RLQ abdominal hernia that he can not reduce for several hours.  Pt has N/V.  Normal BM, passing gas.  No fever, chills.  Has had to have procedural sedation at [facility] to reduce this hernia.  Has had prior hernia repair that failed and is considered too high risk for repeat surgery.  ROS: See HPI Constitutional: no fever  Eyes: no drainage  ENT: no runny nose   Cardiovascular:  no chest pain  Resp: no SOB  GI: vomiting GU: no dysuria Integumentary: no rash  Allergy: no hives  Musculoskeletal: no leg swelling  Neurological: no slurred speech ROS otherwise negative  PAST MEDICAL HISTORY/PAST SURGICAL HISTORY:  Past Medical History  Diagnosis Date  . Heart transplanted   . Gout   . Hypertension   . Hypercholesteremia   . Chronic renal insufficiency   . OSA (obstructive sleep apnea)     MEDICATIONS:  Prior to Admission medications   Medication Sig Start Date End Date Taking? Authorizing Provider  allopurinol (ZYLOPRIM) 300 MG tablet Take 300 mg by mouth daily.    Historical Provider, MD  amoxicillin (AMOXIL) 500 MG capsule Take 500 mg by mouth 3 (three) times daily. Prn dentist    Historical Provider, MD  aspirin 81 MG tablet Take 81 mg by mouth daily.    Historical Provider, MD  atorvastatin (LIPITOR) 40 MG tablet Take 40 mg by mouth daily.    Historical Provider, MD  calcium-vitamin D (OSCAL WITH D) 500-200 MG-UNIT per tablet Take 1 tablet by mouth daily.    Historical Provider, MD  carvedilol (COREG) 12.5 MG tablet Take 12.5 mg by mouth 2 (two) times daily with a meal.    Historical Provider, MD  colchicine 0.6 MG tablet Take 0.6 mg by mouth as needed.    Historical Provider, MD  cycloSPORINE modified (GENGRAF) 100 MG capsule Take 100 mg by mouth 2 (two) times daily.    Historical  Provider, MD  fish oil-omega-3 fatty acids 1000 MG capsule Take 2 g by mouth daily.    Historical Provider, MD  lisinopril (PRINIVIL,ZESTRIL) 20 MG tablet Take 20 mg by mouth daily.    Historical Provider, MD  magnesium oxide (MAG-OX) 400 MG tablet Take 400 mg by mouth daily.    Historical Provider, MD  Multiple Vitamin (MULTIVITAMIN WITH MINERALS) TABS Take 1 tablet by mouth daily.    Historical Provider, MD  mycophenolate (CELLCEPT) 500 MG tablet Take 1,500 mg by mouth 2 (two) times daily.    Historical Provider, MD    ALLERGIES:  Allergies  Allergen Reactions  . Acyclovir And Related   . Rapamune [Sirolimus]   . Verapamil     INTOLERANCE RASH/ULCERS    SOCIAL HISTORY:  History  Substance Use Topics  . Smoking status: Never Smoker   . Smokeless tobacco: Never Used  . Alcohol Use: No    FAMILY HISTORY: Family History  Problem Relation Age of Onset  . Throat cancer Father   . Dementia Mother   . Other Mother     HYPOTENSTION    EXAM: BP 183/101  Pulse 79  Temp(Src) 97.7 F (36.5 C) (Oral)  Resp 16  SpO2 100% CONSTITUTIONAL: Alert and oriented and responds appropriately to questions. Well-appearing; well-nourished HEAD: Normocephalic EYES: Conjunctivae clear, PERRL ENT: normal nose; no  rhinorrhea; moist mucous membranes; pharynx without lesions noted NECK: Supple, no meningismus, no LAD  CARD: RRR; S1 and S2 appreciated; no murmurs, no clicks, no rubs, no gallops RESP: Normal chest excursion without splinting or tachypnea; breath sounds clear and equal bilaterally; no wheezes, no rhonchi, no rales,  ABD/GI: Normal bowel sounds; non-distended; soft, obese, multiple large ventral hernias, central hernia is soft and easily reducible, RLQ hernia is hard and TTP without overlying skin changes BACK:  The back appears normal and is non-tender to palpation, there is no CVA tenderness EXT: Normal ROM in all joints; non-tender to palpation; no edema; normal capillary refill; no  cyanosis    SKIN: Normal color for age and race; warm NEURO: Moves all extremities equally PSYCH: The patient's mood and manner are appropriate. Grooming and personal hygiene are appropriate.  MEDICAL DECISION MAKING: Pt here with irreducible hernia.  Attempted to reduce in Trendelenburg without success.  Will give pain meds and attempt to reduce again.  ED PROGRESS: Able to reduce hernia with constant pressure for 5 minutes after morphine.  Labs at baseline.  Pt reports feeling much better and no further vomiting.  WIll dc home with return precautions and supportive care instructions.     Hernia reduction Date/Time: 06/09/2014 1:07 PM Performed by: Nyra Jabs Authorized by: Nyra Jabs Consent: Verbal consent obtained. Consent given by: patient Patient identity confirmed: verbally with patient Local anesthesia used: no Patient sedated: no Reduced RLQ hernia using constant pressure for 5 minutes Patient tolerance: Patient tolerated the procedure well with no immediate complications.    Maunie, DO 06/09/14 2213

## 2014-06-09 NOTE — Telephone Encounter (Signed)
[id number] x1 

## 2014-06-09 NOTE — Discharge Instructions (Signed)

## 2014-06-10 NOTE — Telephone Encounter (Signed)
I spoke with patient about results and he verbalized understanding and had no questions 

## 2014-10-05 ENCOUNTER — Other Ambulatory Visit: Payer: Self-pay | Admitting: Internal Medicine

## 2014-10-05 DIAGNOSIS — R740 Nonspecific elevation of levels of transaminase and lactic acid dehydrogenase [LDH]: Principal | ICD-10-CM

## 2014-10-05 DIAGNOSIS — IMO0002 Reserved for concepts with insufficient information to code with codable children: Secondary | ICD-10-CM

## 2014-10-12 ENCOUNTER — Ambulatory Visit
Admission: RE | Admit: 2014-10-12 | Discharge: 2014-10-12 | Disposition: A | Discharge status: Home / Self Care | Payer: Medicare Other | Source: Ambulatory Visit | Attending: Internal Medicine | Admitting: Internal Medicine

## 2014-10-12 DIAGNOSIS — R740 Nonspecific elevation of levels of transaminase and lactic acid dehydrogenase [LDH]: Principal | ICD-10-CM

## 2014-10-12 DIAGNOSIS — IMO0002 Reserved for concepts with insufficient information to code with codable children: Secondary | ICD-10-CM

## 2014-12-10 ENCOUNTER — Other Ambulatory Visit: Payer: Self-pay | Admitting: Dermatology

## 2015-02-21 ENCOUNTER — Other Ambulatory Visit: Payer: Self-pay

## 2015-03-09 ENCOUNTER — Emergency Department (HOSPITAL_COMMUNITY): Payer: Medicare Other

## 2015-03-09 ENCOUNTER — Encounter (HOSPITAL_COMMUNITY): Payer: Self-pay | Admitting: *Deleted

## 2015-03-09 ENCOUNTER — Emergency Department (HOSPITAL_COMMUNITY)
Admission: EM | Admit: 2015-03-09 | Discharge: 2015-03-10 | Disposition: A | Discharge status: Home / Self Care | Payer: Medicare Other | Attending: Emergency Medicine | Admitting: Emergency Medicine

## 2015-03-09 DIAGNOSIS — Z941 Heart transplant status: Secondary | ICD-10-CM | POA: Diagnosis not present

## 2015-03-09 DIAGNOSIS — H538 Other visual disturbances: Secondary | ICD-10-CM | POA: Insufficient documentation

## 2015-03-09 DIAGNOSIS — E782 Mixed hyperlipidemia: Secondary | ICD-10-CM | POA: Insufficient documentation

## 2015-03-09 DIAGNOSIS — H4710 Unspecified papilledema: Secondary | ICD-10-CM | POA: Diagnosis not present

## 2015-03-09 DIAGNOSIS — M109 Gout, unspecified: Secondary | ICD-10-CM | POA: Diagnosis not present

## 2015-03-09 DIAGNOSIS — Z7982 Long term (current) use of aspirin: Secondary | ICD-10-CM | POA: Insufficient documentation

## 2015-03-09 DIAGNOSIS — R519 Headache, unspecified: Secondary | ICD-10-CM

## 2015-03-09 DIAGNOSIS — R51 Headache: Secondary | ICD-10-CM | POA: Diagnosis present

## 2015-03-09 DIAGNOSIS — H539 Unspecified visual disturbance: Secondary | ICD-10-CM

## 2015-03-09 DIAGNOSIS — I10 Essential (primary) hypertension: Secondary | ICD-10-CM | POA: Insufficient documentation

## 2015-03-09 DIAGNOSIS — Z79899 Other long term (current) drug therapy: Secondary | ICD-10-CM | POA: Diagnosis not present

## 2015-03-09 DIAGNOSIS — S0540XA Penetrating wound of orbit with or without foreign body, unspecified eye, initial encounter: Secondary | ICD-10-CM

## 2015-03-09 DIAGNOSIS — Z8669 Personal history of other diseases of the nervous system and sense organs: Secondary | ICD-10-CM | POA: Insufficient documentation

## 2015-03-09 LAB — CBC WITH DIFFERENTIAL/PLATELET
BASOS ABS: 0 10*3/uL (ref 0.0–0.1)
Basophils Relative: 0 % (ref 0–1)
EOS PCT: 4 % (ref 0–5)
Eosinophils Absolute: 0.3 10*3/uL (ref 0.0–0.7)
HEMATOCRIT: 39.1 % (ref 39.0–52.0)
HEMOGLOBIN: 13.1 g/dL (ref 13.0–17.0)
Lymphocytes Relative: 17 % (ref 12–46)
Lymphs Abs: 1.3 10*3/uL (ref 0.7–4.0)
MCH: 29.2 pg (ref 26.0–34.0)
MCHC: 33.5 g/dL (ref 30.0–36.0)
MCV: 87.1 fL (ref 78.0–100.0)
Monocytes Absolute: 0.7 10*3/uL (ref 0.1–1.0)
Monocytes Relative: 9 % (ref 3–12)
Neutro Abs: 5.1 10*3/uL (ref 1.7–7.7)
Neutrophils Relative %: 70 % (ref 43–77)
Platelets: 152 10*3/uL (ref 150–400)
RBC: 4.49 MIL/uL (ref 4.22–5.81)
RDW: 14 % (ref 11.5–15.5)
WBC: 7.5 10*3/uL (ref 4.0–10.5)

## 2015-03-09 LAB — C-REACTIVE PROTEIN: CRP: 1 mg/dL — ABNORMAL HIGH (ref <1.0)

## 2015-03-09 LAB — SEDIMENTATION RATE: SED RATE: 20 mm/h — AB (ref 0–16)

## 2015-03-09 NOTE — ED Notes (Signed)
Spoke with tech first Mat states patient in X-ray then MRI.

## 2015-03-09 NOTE — ED Notes (Signed)
Patient not in room

## 2015-03-09 NOTE — ED Notes (Signed)
Patient still in MRI.  

## 2015-03-09 NOTE — ED Notes (Addendum)
PT states has been sick for one month and had sinus problems on right side.  Pt had antibiotics.  Then noted problems with right eye are blurry and not sure when it started.  Pt came from eye MD and had his right eye dilated.  Pt was sent here for MRI of orbits and lab work up with esr crp, cbc.  Sent here r/o temporal arteritis and headache

## 2015-03-09 NOTE — ED Provider Notes (Signed)
CSN: [id number]     Arrival date & time 03/09/15  1705 History   First MD Initiated Contact with Patient 03/09/15 1906     Chief Complaint  Patient presents with  . Headache     (Consider location/radiation/quality/duration/timing/severity/associated sxs/prior Treatment) HPI Comments: Patient presents to the ER for evaluation of mild headache with right eye blurred vision. Patient reports that he had a sinus infection earlier in the month was treated with antibiotic. Since then he has continued to have headache and now is in the last couple of days noticed great eye blurred vision. He went to his ophthalmologist today, had an eye exam and was found to have swelling of his optic nerves, was sent to the ER to rule out temporal arteritis.  Patient is a 69 y.o. male presenting with headaches.  Headache   Past Medical History  Diagnosis Date  . Heart transplanted   . Gout   . Hypertension   . Hypercholesteremia   . Chronic renal insufficiency   . OSA (obstructive sleep apnea)    Past Surgical History  Procedure Laterality Date  . Cardiac surgery  2005    heart cath  . Hernia repair    . Heart transplant  2002  . Endomyocardial biopsy  2005  . Cystectomy      EPIDERMOID  . Pericardium surgery  2002    BX   . Pericardiocentesis  2010   Family History  Problem Relation Age of Onset  . Throat cancer Father   . Dementia Mother   . Other Mother     HYPOTENSTION   History  Substance Use Topics  . Smoking status: Never Smoker   . Smokeless tobacco: Never Used  . Alcohol Use: No    Review of Systems  Eyes: Positive for visual disturbance.  Neurological: Positive for headaches.  All other systems reviewed and are negative.     Allergies  Rapamune and Verapamil  Home Medications   Prior to Admission medications   Medication Sig Start Date End Date Taking? Authorizing Provider  allopurinol (ZYLOPRIM) 300 MG tablet Take 300 mg by mouth daily.   Yes Historical  Provider, MD  amoxicillin (AMOXIL) 500 MG capsule Take 500 mg by mouth 3 (three) times daily as needed (for dental procedures).    Yes Historical Provider, MD  aspirin 81 MG tablet Take 81 mg by mouth daily.   Yes Historical Provider, MD  atorvastatin (LIPITOR) 40 MG tablet Take 40 mg by mouth daily.   Yes Historical Provider, MD  calcium-vitamin D (OSCAL WITH D) 500-200 MG-UNIT per tablet Take 1 tablet by mouth daily.   Yes Historical Provider, MD  carvedilol (COREG) 12.5 MG tablet Take 12.5 mg by mouth 2 (two) times daily with a meal.   Yes Historical Provider, MD  cycloSPORINE modified (GENGRAF) 100 MG capsule Take 100 mg by mouth 2 (two) times daily.   Yes Historical Provider, MD  fish oil-omega-3 fatty acids 1000 MG capsule Take 2 g by mouth 2 (two) times daily.    Yes Historical Provider, MD  lisinopril (PRINIVIL,ZESTRIL) 20 MG tablet Take 20 mg by mouth daily.   Yes Historical Provider, MD  magnesium oxide (MAG-OX) 400 MG tablet Take 400 mg by mouth daily.   Yes Historical Provider, MD  Multiple Vitamin (MULTIVITAMIN WITH MINERALS) TABS Take 1 tablet by mouth daily.   Yes Historical Provider, MD  mycophenolate (CELLCEPT) 500 MG tablet Take 1,500 mg by mouth 2 (two) times daily.   Yes Historical  Provider, MD  colchicine 0.6 MG tablet Take 0.6 mg by mouth as needed (for gout).     Historical Provider, MD  HYDROcodone-acetaminophen (NORCO/VICODIN) 5-325 MG per tablet Take 1 tablet by mouth every 6 (six) hours as needed. Patient not taking: Reported on 03/09/2015 06/09/14   Kristen N Ward, DO  ondansetron (ZOFRAN ODT) 4 MG disintegrating tablet Take 1 tablet (4 mg total) by mouth every 8 (eight) hours as needed for nausea or vomiting. Patient not taking: Reported on 03/09/2015 06/09/14   Kristen N Ward, DO   BP 152/75 mmHg  Pulse 85  Temp(Src) 98 F (36.7 C) (Oral)  Resp 19  Ht 6\' 2"  (1.88 m)  Wt 221 lb 12.8 oz (100.608 kg)  BMI 28.47 kg/m2  SpO2 91% Physical Exam  Constitutional: He is  oriented to person, place, and time. He appears well-developed and well-nourished. No distress.  HENT:  Head: Normocephalic and atraumatic.  Right Ear: Hearing normal.  Left Ear: Hearing normal.  Nose: Nose normal.  Mouth/Throat: Oropharynx is clear and moist and mucous membranes are normal.  Eyes: Conjunctivae and EOM are normal.  Fundoscopic exam:      The right eye shows papilledema.       The left eye shows papilledema.  Neck: Normal range of motion. Neck supple.  Cardiovascular: Regular rhythm, S1 normal and S2 normal.  Exam reveals no gallop and no friction rub.   No murmur heard. Pulmonary/Chest: Effort normal and breath sounds normal. No respiratory distress. He exhibits no tenderness.  Abdominal: Soft. Normal appearance and bowel sounds are normal. There is no hepatosplenomegaly. There is no tenderness. There is no rebound, no guarding, no tenderness at McBurney's point and negative Murphy's sign. No hernia.  Musculoskeletal: Normal range of motion.  Neurological: He is alert and oriented to person, place, and time. He has normal strength. No cranial nerve deficit or sensory deficit. Coordination normal. GCS eye subscore is 4. GCS verbal subscore is 5. GCS motor subscore is 6.  Skin: Skin is warm, dry and intact. No rash noted. No cyanosis.  Psychiatric: He has a normal mood and affect. His speech is normal and behavior is normal. Thought content normal.  Nursing note and vitals reviewed.   ED Course  LUMBAR PUNCTURE Date/Time: 03/09/2015 11:55 PM Performed by: Orpah Greek Authorized by: Orpah Greek Consent: Verbal consent obtained. Written consent obtained. Risks and benefits: risks, benefits and alternatives were discussed Consent given by: patient Patient understanding: patient states understanding of the procedure being performed Patient consent: the patient's understanding of the procedure matches consent given Procedure consent: procedure consent  matches procedure scheduled Relevant documents: relevant documents present and verified Test results: test results available and properly labeled Site marked: the operative site was marked Imaging studies: imaging studies available Required items: required blood products, implants, devices, and special equipment available Patient identity confirmed: verbally with patient, hospital-assigned identification number and arm band Time out: Immediately prior to procedure a "time out" was called to verify the correct patient, procedure, equipment, support staff and site/side marked as required. Indications: Evaluation for pseudotumor cerebri. Anesthesia: local infiltration Local anesthetic: lidocaine 1% without epinephrine Anesthetic total: 5 ml Patient sedated: no Lumbar space: L3-L4 interspace Patient's position: left lateral decubitus Needle gauge: 20 Needle type: spinal needle - Quincke tip Needle length: 3.5 in Number of attempts: 3 Opening pressure: 18 cm H2O Fluid appearance: blood-tinged then clearing Tubes of fluid: 4 Total volume: 6 ml Post-procedure: site cleaned and adhesive bandage applied Patient  tolerance: Patient tolerated the procedure well with no immediate complications   (including critical care time) Labs Review Labs Reviewed  SEDIMENTATION RATE - Abnormal; Notable for the following:    Sed Rate 20 (*)    All other components within normal limits  C-REACTIVE PROTEIN - Abnormal; Notable for the following:    CRP 1.0 (*)    All other components within normal limits  CSF CULTURE  CBC WITH DIFFERENTIAL/PLATELET  CSF CELL COUNT WITH DIFFERENTIAL  CSF CELL COUNT WITH DIFFERENTIAL  GLUCOSE, CSF  PROTEIN, CSF    Imaging Review Dg Eye Foreign Body  03/09/2015   CLINICAL DATA:  Metal working/exposure; clearance prior to MRI  EXAM: ORBITS FOR FOREIGN BODY - 2 VIEW  COMPARISON:  None.  FINDINGS: There is no evidence of metallic foreign body within the orbits. No  significant bone abnormality identified.  IMPRESSION: No evidence of metallic foreign body within the orbits.   Electronically Signed   By: Inez Catalina M.D.   On: 03/09/2015 19:23   Mr Brain Wo Contrast  03/09/2015   CLINICAL DATA:  Initial evaluation for right eye visual changes.  EXAM: MRI HEAD WITHOUT CONTRAST  TECHNIQUE: Multiplanar, multiecho pulse sequences of the brain and surrounding structures were obtained without intravenous contrast.  COMPARISON:  Prior study performed earlier on the same day.  FINDINGS: Cerebral volume within normal limits for patient age. No significant white matter disease present. Delete that  No intracranial infarct identified on DWI scant from earlier the same day. Gray-white matter differentiation maintained normal intravascular flow voids are preserved. No acute intracranial hemorrhage. A few tiny foci of susceptibility artifact scattered throughout the supratentorial brain, consistent with small chronic micro hemorrhages.  No mass lesion or midline shift. No mass effect. No hydrocephalus. No extra-axial fluid collection.  Craniocervical junction within normal limits. Prominence of the extra-axial space present at the foramen magnum. Pituitary gland normal.  No acute abnormality about the orbits.  Very mild mucosal thickening within the anterior ethmoidal air cells. No air-fluid levels to suggest active sinus infection. Single opacified left mastoid air cell noted without mastoid effusion. Inner ear structures within normal limits.  Bone marrow signal intensity normal. Scalp soft tissues are unremarkable.  IMPRESSION: 1. No acute intracranial process. 2. Few scattered subcentimeter foci of susceptibility artifact scattered throughout the supratentorial brain, consistent with small chronic micro hemorrhages. These are most likely related to chronic underlying hypertension. 3. Otherwise unremarkable brain MRI.   Electronically Signed   By: Jeannine Boga M.D.   On:  03/09/2015 22:11   Mr Farley Ly Cm  03/09/2015   CLINICAL DATA:  Blurred vision right eye. Sinusitis being treated with antibiotics. Headache.  EXAM: MRI OF THE brain and ORBITS WITHOUT CONTRAST  TECHNIQUE: Multiplanar, multisequence MR imaging of the orbits was performed. No intravenous contrast was administered.  COMPARISON:  None.  FINDINGS: Diffusion imaging does not show any acute or subacute infarction. No evidence intracranial mass lesion, hemorrhage, hydrocephalus or extra-axial collection.  Both globes are normal. Both optic nerves are normal. Extra-ocular muscles are symmetric and normal. Intraorbital fat is symmetric and normal. No vascular lesion. Lacrimal glands are normal. No adjacent advanced sinus inflammation. Very mild mucosal thickening in the frontal and anterior ethmoid regions. No abnormality seen at either orbital apex. Cavernous sinus regions appear normal.  IMPRESSION: Normal examination. No cause of the presenting symptoms is identified.   Electronically Signed   By: Nelson Chimes M.D.   On: 03/09/2015 20:13  EKG Interpretation None      MDM   Final diagnoses:  Vision changes  Papilledema    Patient presents to the ER for evaluation of change in vision in right eye. Patient is experiencing monocular vision change. If he covers his left eye he has blurred vision and decreased light sensation, but left eye is normal with right eye closed. Patient underwent a complete ophthalmologic evaluation today. Everything was normal except for bilateral papilledema. He was sent to the ER to have MRI orbits and he sedimentation rate to rule out temporal arteritis. Patient's sedimentation rate is 20, this does not support a diagnosis of temporal arteritis. MRI of orbits was also normal.  Case was discussed with Dr. Armida Sans, on call for neurology. He recommended MRI of brain to further evaluate. MRI of brain was performed and does not show any acute lesion. Based on normal sedimentation  rate, normal MRI of orbits, normal MRI of brain, the next etiology of papilledema considered was pseudotumor cerebri. Lumbar puncture was performed. Patient's opening pressure was 18. This is not consistent with pseudotumor cerebri.  These findings were discussed with Dr. Armida Sans. He felt that this rules out all of the worrisome etiologies and that the patient could be discharged safely to follow-up with his ophthalmologist tomorrow for repeat evaluation and further consideration.    Orpah Greek, MD 03/10/15 [phone]

## 2015-03-09 NOTE — ED Notes (Signed)
Consent at bedside.  

## 2015-03-09 NOTE — ED Notes (Signed)
MD advised to hold off on IV until Lumbar puncture.

## 2015-03-10 LAB — CSF CELL COUNT WITH DIFFERENTIAL
RBC Count, CSF: 3150 /mm3 — ABNORMAL HIGH
RBC Count, CSF: 654 /mm3 — ABNORMAL HIGH
TUBE #: 1
Tube #: 4
WBC, CSF: 1 /mm3 (ref 0–5)
WBC, CSF: 2 /mm3 (ref 0–5)

## 2015-03-10 LAB — GLUCOSE, CSF: GLUCOSE CSF: 58 mg/dL (ref 40–70)

## 2015-03-10 LAB — PROTEIN, CSF: Total  Protein, CSF: 49 mg/dL — ABNORMAL HIGH (ref 15–45)

## 2015-03-10 MED ORDER — SODIUM CHLORIDE 0.9 % IV BOLUS (SEPSIS)
1000.0000 mL | Freq: Once | INTRAVENOUS | Status: AC
  Administered 2015-03-10: 1000 mL via INTRAVENOUS

## 2015-03-10 NOTE — Discharge Instructions (Signed)
Visual Disturbances °You have had a disturbance in your vision. This may be caused by various conditions, such as: °· Migraines. Migraine headaches are often preceded by a disturbance in vision. Blind spots or light flashes are followed by a headache. This type of visual disturbance is temporary. It does not damage the eye. °· Glaucoma. This is caused by increased pressure in the eye. Symptoms include haziness, blurred vision, or seeing rainbow colored circles when looking at bright lights. Partial or complete visual loss can occur. You may or may not experience eye pain. Visual loss may be gradual or sudden and is irreversible. Glaucoma is the leading cause of blindness. °· Retina problems. Vision will be reduced if the retina becomes detached or if there is a circulation problem as with diabetes, high blood pressure, or a mini-stroke. Symptoms include seeing "floaters," flashes of light, or shadows, as if a curtain has fallen over your eye. °· Optic nerve problems. The main nerve in your eye can be damaged by redness, soreness, and swelling (inflammation), poor circulation, drugs, and toxins. °It is very important to have a complete exam done by a specialist to determine the exact cause of your eye problem. The specialist may recommend medicines or surgery, depending on the cause of the problem. This can help prevent further loss of vision or reduce the risk of having a stroke. Contact the caregiver to whom you have been referred and arrange for follow-up care right away. °SEEK IMMEDIATE MEDICAL CARE IF:  °· Your vision gets worse. °· You develop severe headaches. °· You have any weakness or numbness in the face, arms, or legs. °· You have any trouble speaking or walking. °Document Released: 09/20/2004 Document Revised: 11/05/2011 Document Reviewed: 01/11/2010 °[employer]® Patient Information ©2015 [employer]. This information is not intended to replace advice given to you by your health care provider. Make sure  you discuss any questions you have with your health care provider. ° °

## 2015-03-10 NOTE — ED Notes (Signed)
Family at bedside. 

## 2015-03-10 NOTE — ED Notes (Signed)
Patient is alert and orientedx4.  Patient was explained discharge instructions and they understood them with no questions.  The patient's Brian Suarez is taking the patient home.

## 2015-03-13 LAB — CSF CULTURE W GRAM STAIN: Culture: NO GROWTH

## 2015-03-13 LAB — CSF CULTURE

## 2015-05-03 ENCOUNTER — Emergency Department (HOSPITAL_COMMUNITY)
Admission: EM | Admit: 2015-05-03 | Discharge: 2015-05-03 | Disposition: A | Discharge status: Home / Self Care | Payer: Medicare Other | Attending: Emergency Medicine | Admitting: Emergency Medicine

## 2015-05-03 ENCOUNTER — Encounter (HOSPITAL_COMMUNITY): Payer: Self-pay | Admitting: Emergency Medicine

## 2015-05-03 DIAGNOSIS — Z8669 Personal history of other diseases of the nervous system and sense organs: Secondary | ICD-10-CM | POA: Insufficient documentation

## 2015-05-03 DIAGNOSIS — K439 Ventral hernia without obstruction or gangrene: Secondary | ICD-10-CM

## 2015-05-03 DIAGNOSIS — M109 Gout, unspecified: Secondary | ICD-10-CM | POA: Insufficient documentation

## 2015-05-03 DIAGNOSIS — Z79899 Other long term (current) drug therapy: Secondary | ICD-10-CM | POA: Insufficient documentation

## 2015-05-03 DIAGNOSIS — I129 Hypertensive chronic kidney disease with stage 1 through stage 4 chronic kidney disease, or unspecified chronic kidney disease: Secondary | ICD-10-CM | POA: Insufficient documentation

## 2015-05-03 DIAGNOSIS — N189 Chronic kidney disease, unspecified: Secondary | ICD-10-CM | POA: Diagnosis not present

## 2015-05-03 DIAGNOSIS — E780 Pure hypercholesterolemia: Secondary | ICD-10-CM | POA: Diagnosis not present

## 2015-05-03 DIAGNOSIS — Z7982 Long term (current) use of aspirin: Secondary | ICD-10-CM | POA: Diagnosis not present

## 2015-05-03 DIAGNOSIS — Z941 Heart transplant status: Secondary | ICD-10-CM | POA: Diagnosis not present

## 2015-05-03 DIAGNOSIS — K469 Unspecified abdominal hernia without obstruction or gangrene: Secondary | ICD-10-CM | POA: Diagnosis not present

## 2015-05-03 DIAGNOSIS — R109 Unspecified abdominal pain: Secondary | ICD-10-CM | POA: Diagnosis present

## 2015-05-03 LAB — BASIC METABOLIC PANEL
Anion gap: 11 (ref 5–15)
BUN: 52 mg/dL — ABNORMAL HIGH (ref 6–20)
CO2: 22 mmol/L (ref 22–32)
CREATININE: 2.45 mg/dL — AB (ref 0.61–1.24)
Calcium: 10 mg/dL (ref 8.9–10.3)
Chloride: 105 mmol/L (ref 101–111)
GFR calc non Af Amer: 25 mL/min — ABNORMAL LOW (ref >60)
GFR, EST AFRICAN AMERICAN: 29 mL/min — AB (ref >60)
Glucose, Bld: 153 mg/dL — ABNORMAL HIGH (ref 65–99)
Potassium: 4.5 mmol/L (ref 3.5–5.1)
Sodium: 138 mmol/L (ref 135–145)

## 2015-05-03 LAB — CBC WITH DIFFERENTIAL/PLATELET
BASOS PCT: 0 % (ref 0–1)
Basophils Absolute: 0 10*3/uL (ref 0.0–0.1)
EOS ABS: 0 10*3/uL (ref 0.0–0.7)
Eosinophils Relative: 0 % (ref 0–5)
HCT: 42.1 % (ref 39.0–52.0)
Hemoglobin: 14.2 g/dL (ref 13.0–17.0)
Lymphocytes Relative: 6 % — ABNORMAL LOW (ref 12–46)
Lymphs Abs: 0.6 10*3/uL — ABNORMAL LOW (ref 0.7–4.0)
MCH: 29.7 pg (ref 26.0–34.0)
MCHC: 33.7 g/dL (ref 30.0–36.0)
MCV: 88.1 fL (ref 78.0–100.0)
MONOS PCT: 7 % (ref 3–12)
Monocytes Absolute: 0.8 10*3/uL (ref 0.1–1.0)
Neutro Abs: 9.6 10*3/uL — ABNORMAL HIGH (ref 1.7–7.7)
Neutrophils Relative %: 87 % — ABNORMAL HIGH (ref 43–77)
Platelets: 160 10*3/uL (ref 150–400)
RBC: 4.78 MIL/uL (ref 4.22–5.81)
RDW: 13.5 % (ref 11.5–15.5)
WBC: 11.1 10*3/uL — AB (ref 4.0–10.5)

## 2015-05-03 MED ORDER — MORPHINE SULFATE (PF) 4 MG/ML IV SOLN
4.0000 mg | Freq: Once | INTRAVENOUS | Status: AC
  Administered 2015-05-03: 4 mg via INTRAVENOUS
  Filled 2015-05-03: qty 1

## 2015-05-03 MED ORDER — ONDANSETRON HCL 4 MG/2ML IJ SOLN
4.0000 mg | Freq: Once | INTRAMUSCULAR | Status: AC
  Administered 2015-05-03: 4 mg via INTRAVENOUS
  Filled 2015-05-03: qty 2

## 2015-05-03 MED ORDER — DIAZEPAM 5 MG/ML IJ SOLN
5.0000 mg | Freq: Once | INTRAMUSCULAR | Status: AC
  Administered 2015-05-03: 5 mg via INTRAVENOUS
  Filled 2015-05-03: qty 2

## 2015-05-03 NOTE — ED Provider Notes (Signed)
CSN: [id number]     Arrival date & time 05/03/15  0855 History   First MD Initiated Contact with Patient 05/03/15 [phone]     Chief Complaint  Patient presents with  . Abdominal Pain  . Hernia     (Consider location/radiation/quality/duration/timing/severity/associated sxs/prior Treatment) HPI  69 year old male with a history of a heart transplant as well as both the periumbilical and right-sided abdominal wall hernia presents with an irreducible right-sided hernia since last night. Has had this problem before and had to come to the ER to get it reduced. He was unable to reduce it himself. Has had 2 episodes of vomiting, most recently at 6:30 AM. Not currently nauseated. Patient has a large midline abdominal wall hernia that has been repaired twice but currently is not up for a repeat repair due to his heart problems and due to the hernia not holding. This hernia does not hurt at all, only the right-sided one. Last bowel movement was yesterday.  Past Medical History  Diagnosis Date  . Heart transplanted   . Gout   . Hypertension   . Hypercholesteremia   . Chronic renal insufficiency   . OSA (obstructive sleep apnea)    Past Surgical History  Procedure Laterality Date  . Cardiac surgery  2005    heart cath  . Hernia repair    . Heart transplant  2002  . Endomyocardial biopsy  2005  . Cystectomy      EPIDERMOID  . Pericardium surgery  2002    BX   . Pericardiocentesis  2010   Family History  Problem Relation Age of Onset  . Throat cancer Father   . Dementia Mother   . Other Mother     HYPOTENSTION   Social History  Substance Use Topics  . Smoking status: Never Smoker   . Smokeless tobacco: Never Used  . Alcohol Use: No    Review of Systems  Constitutional: Negative for fever.  Gastrointestinal: Positive for nausea, vomiting and abdominal pain. Negative for diarrhea.  All other systems reviewed and are negative.     Allergies  Rapamune and Verapamil  Home  Medications   Prior to Admission medications   Medication Sig Start Date End Date Taking? Authorizing Provider  allopurinol (ZYLOPRIM) 300 MG tablet Take 300 mg by mouth daily.    Historical Provider, MD  amoxicillin (AMOXIL) 500 MG capsule Take 500 mg by mouth 3 (three) times daily as needed (for dental procedures).     Historical Provider, MD  aspirin 81 MG tablet Take 81 mg by mouth daily.    Historical Provider, MD  atorvastatin (LIPITOR) 40 MG tablet Take 40 mg by mouth daily.    Historical Provider, MD  calcium-vitamin D (OSCAL WITH D) 500-200 MG-UNIT per tablet Take 1 tablet by mouth daily.    Historical Provider, MD  carvedilol (COREG) 12.5 MG tablet Take 12.5 mg by mouth 2 (two) times daily with a meal.    Historical Provider, MD  colchicine 0.6 MG tablet Take 0.6 mg by mouth as needed (for gout).     Historical Provider, MD  cycloSPORINE modified (GENGRAF) 100 MG capsule Take 100 mg by mouth 2 (two) times daily.    Historical Provider, MD  fish oil-omega-3 fatty acids 1000 MG capsule Take 2 g by mouth 2 (two) times daily.     Historical Provider, MD  HYDROcodone-acetaminophen (NORCO/VICODIN) 5-325 MG per tablet Take 1 tablet by mouth every 6 (six) hours as needed. Patient not taking: Reported on  03/09/2015 06/09/14   Kristen N Ward, DO  lisinopril (PRINIVIL,ZESTRIL) 20 MG tablet Take 20 mg by mouth daily.    Historical Provider, MD  magnesium oxide (MAG-OX) 400 MG tablet Take 400 mg by mouth daily.    Historical Provider, MD  Multiple Vitamin (MULTIVITAMIN WITH MINERALS) TABS Take 1 tablet by mouth daily.    Historical Provider, MD  mycophenolate (CELLCEPT) 500 MG tablet Take 1,500 mg by mouth 2 (two) times daily.    Historical Provider, MD  ondansetron (ZOFRAN ODT) 4 MG disintegrating tablet Take 1 tablet (4 mg total) by mouth every 8 (eight) hours as needed for nausea or vomiting. Patient not taking: Reported on 03/09/2015 06/09/14   Kristen N Ward, DO   BP 158/85 mmHg  Pulse 94   Temp(Src) 97.6 F (36.4 C) (Oral)  Resp 20  SpO2 100% Physical Exam  Constitutional: He is oriented to person, place, and time. He appears well-developed and well-nourished.  HENT:  Head: Normocephalic and atraumatic.  Right Ear: External ear normal.  Left Ear: External ear normal.  Nose: Nose normal.  Eyes: Right eye exhibits no discharge. Left eye exhibits no discharge.  Neck: Neck supple.  Cardiovascular: Normal rate, regular rhythm, normal heart sounds and intact distal pulses.   Pulmonary/Chest: Effort normal and breath sounds normal.  Abdominal: Soft. There is tenderness.    Musculoskeletal: He exhibits no edema.  Neurological: He is alert and oriented to person, place, and time.  Skin: Skin is warm and dry. He is not diaphoretic.  Nursing note and vitals reviewed.   ED Course  Procedures (including critical care time) Labs Review Labs Reviewed  BASIC METABOLIC PANEL - Abnormal; Notable for the following:    Glucose, Bld 153 (*)    BUN 52 (*)    Creatinine, Ser 2.45 (*)    GFR calc non Af Amer 25 (*)    GFR calc Af Amer 29 (*)    All other components within normal limits  CBC WITH DIFFERENTIAL/PLATELET - Abnormal; Notable for the following:    WBC 11.1 (*)    Neutrophils Relative % 87 (*)    Neutro Abs 9.6 (*)    Lymphocytes Relative 6 (*)    Lymphs Abs 0.6 (*)    All other components within normal limits    Imaging Review No results found. I have personally reviewed and evaluated these images and lab results as part of my medical decision-making.   EKG Interpretation None      MDM   Final diagnoses:  Abdominal wall hernia    After pain medicine, ice and trendelenburg I could not reduce his hernia. Pain significantly improved and no signs of necrosis or ischemia. Surgery consulted, after valium they are able to reduce. Able to tolerate oral fluids. F/u with [facility] surgeon.    Sherwood Gambler, MD 05/03/15 [phone]

## 2015-05-03 NOTE — Consult Note (Signed)
Reason for Consult: Periumbilical hernia and right lower abdominal wall hernia, that will not reduce. PCP: Jani Gravel, MD  Cardiology:  Dr. Gertie Gowda  Referring Physician: Sherwood Gambler   Brian Suarez is an 69 y.o. male.  HPI: Pt comes to the ED with right lower abdominal wall hernia.  He has had multiple abdominal wall hernia's in the past. He and his wife can normally reduce them without much assistance.  They are always on the right side of the abdomen and usually closer to the midline. The current hernia that he is concerned about started last PM.  He was eating supper and felt obstructed and discomfort right side.  He stopped eating. He and his wife tried to get the hernia to reduce at home.  All efforts at home have failed.  He developed nausea and vomiting around 0130 hours this AM.  He had more nausea and vomiting at 0630 AM.  He eventually went to the Ed for evaluation and assistance in reducing the hernia.   He has had 2 prior umbilical hernia repairs at [facility], both have failed.  Currently he is not having nausea or vomiting, he did get some morphine after admit to the Ed and feels better, but the hard area RLQ is still present.  He has some ice on it and we were called to see.  No films currently.  CT from 2013 shows 2 anterior wall hernias.  The current exam shows a new one not note on that CT RLQ. Labs show creatinine is up and WBC is up slightly. Creatinine appears to be at baseline  Past Medical History  Diagnosis Date  Heart transplanted 2002 [facility]  Recurrent pericardial effusion, pulmonary hypertension, dilated cardiomyopathy.   Recurrent Hernia repair  X 2 at [facility];  2002, & 2006    OSA (obstructive sleep apnea)   Chronic renal insufficiency   Hypertension  Hypercholesteremia     Hx of gout        Past Surgical History  Procedure Laterality Date  Heart transplant  2002      Endomyocardial biopsy  2005    Subxiphoid pericardial resection  2012 Dr. Arlyce Dice    Pericardiocentesis  2010          Cardiac surgery  2005   heart cath      Cystectomy    EPIDERMOID  Hernia repair          Family History  Problem Relation Age of Onset  . Throat cancer Father   . Dementia Mother   . Other Mother     HYPOTENSTION    Social History:  reports that he has never smoked. He has never used smokeless tobacco. He reports that he does not drink alcohol or use illicit drugs.  Allergies:  Allergies  Allergen Reactions  . Rapamune [Sirolimus] Other (See Comments)    Pt states that this medication causes headaches.   . Verapamil Other (See Comments)    Reaction: Rash and ulcers   Prior to Admission medications   Medication Sig Start Date End Date Taking? Authorizing Provider  allopurinol (ZYLOPRIM) 300 MG tablet Take 300 mg by mouth daily.   Yes Historical Provider, MD  amoxicillin (AMOXIL) 500 MG capsule Take 500 mg by mouth 3 (three) times daily as needed (for dental procedures).    Yes Historical Provider, MD  aspirin EC 81 MG tablet Take 81 mg by mouth daily.   Yes Historical Provider, MD  atorvastatin (LIPITOR) 40 MG tablet Take  40 mg by mouth daily.   Yes Historical Provider, MD  calcium-vitamin D (OSCAL WITH D) 500-200 MG-UNIT per tablet Take 1 tablet by mouth daily.   Yes Historical Provider, MD  carvedilol (COREG) 12.5 MG tablet Take 12.5 mg by mouth 2 (two) times daily with a meal.   Yes Historical Provider, MD  colchicine 0.6 MG tablet Take 0.6 mg by mouth as needed (for gout).    Yes Historical Provider, MD  cycloSPORINE modified (GENGRAF) 100 MG capsule Take 100 mg by mouth 2 (two) times daily.   Yes Historical Provider, MD  fish oil-omega-3 fatty acids 1000 MG capsule Take 2 g by mouth 2 (two) times daily.    Yes Historical Provider, MD  lisinopril (PRINIVIL,ZESTRIL) 20 MG tablet Take 20 mg by mouth daily.   Yes Historical Provider, MD  magnesium oxide (MAG-OX) 400 MG tablet Take 400 mg by mouth daily.   Yes Historical Provider, MD   Multiple Vitamin (MULTIVITAMIN WITH MINERALS) TABS Take 1 tablet by mouth daily.   Yes Historical Provider, MD  mycophenolate (CELLCEPT) 500 MG tablet Take 1,500 mg by mouth 2 (two) times daily.   Yes Historical Provider, MD     Anti-infectives    None      Results for orders placed or performed during the hospital encounter of 05/03/15 (from the past 48 hour(s))  CBC with Differential     Status: Abnormal   Collection Time: 05/03/15  9:56 AM  Result Value Ref Range   WBC 11.1 (H) 4.0 - 10.5 K/uL   RBC 4.78 4.22 - 5.81 MIL/uL   Hemoglobin 14.2 13.0 - 17.0 g/dL   HCT 42.1 39.0 - 52.0 %   MCV 88.1 78.0 - 100.0 fL   MCH 29.7 26.0 - 34.0 pg   MCHC 33.7 30.0 - 36.0 g/dL   RDW 13.5 11.5 - 15.5 %   Platelets 160 150 - 400 K/uL   Neutrophils Relative % 87 (H) 43 - 77 %   Neutro Abs 9.6 (H) 1.7 - 7.7 K/uL   Lymphocytes Relative 6 (L) 12 - 46 %   Lymphs Abs 0.6 (L) 0.7 - 4.0 K/uL   Monocytes Relative 7 3 - 12 %   Monocytes Absolute 0.8 0.1 - 1.0 K/uL   Eosinophils Relative 0 0 - 5 %   Eosinophils Absolute 0.0 0.0 - 0.7 K/uL   Basophils Relative 0 0 - 1 %   Basophils Absolute 0.0 0.0 - 0.1 K/uL    No results found.  Review of Systems  Constitutional: Negative.   HENT: Negative.   Eyes: Negative.   Respiratory: Negative.   Cardiovascular: Negative.   Gastrointestinal: Positive for nausea, vomiting and abdominal pain. Negative for diarrhea, constipation, blood in stool and melena.  Genitourinary: Negative.   Musculoskeletal: Negative.   Skin: Negative.   Neurological: Negative.   Endo/Heme/Allergies: Negative.   Psychiatric/Behavioral: Negative.    Blood pressure 158/85, pulse 94, temperature 97.6 F (36.4 C), temperature source Oral, resp. rate 20, SpO2 100 %. Physical Exam  Constitutional: He is oriented to person, place, and time. He appears well-developed and well-nourished. No distress.  Overweight, but no distress.  HENT:  Head: Normocephalic and atraumatic.   Nose: Nose normal.  Eyes: EOM are normal. Right eye exhibits no discharge. Left eye exhibits no discharge. No scleral icterus.  Neck: Normal range of motion. No JVD present. No tracheal deviation present. No thyromegaly present.  Cardiovascular: Normal rate, regular rhythm, normal heart sounds and intact distal pulses.  No murmur heard. Respiratory: Breath sounds normal. No respiratory distress. He has no wheezes. He has no rales. He exhibits no tenderness.  GI: Soft. Bowel sounds are normal. He exhibits distension. He exhibits no mass. There is tenderness (he has a Tender and hard area RLQ, that he noted last PM and has become worse.). There is no rebound and no guarding.  He does not appear obstructed currently.  He has a huge [id number] cm umbilical hernia that is soft and easily reducible.  It is not tender, he has good bowel sounds. His current issue is in the RLQ lateral abdominal wall.  It is not an inguinal hernia.  This area was about 4 cm in diameter, it was easily palpable and hard.  At the end of the treatment, this had resolved and was still palpable,but much improved and he feels better.  Musculoskeletal: He exhibits no edema or tenderness.  Lymphadenopathy:    He has no cervical adenopathy.  Neurological: He is alert and oriented to person, place, and time. No cranial nerve deficit.  Skin: Skin is warm and dry. No rash noted. He is not diaphoretic. No erythema. No pallor.  Psychiatric: He has a normal mood and affect. His behavior is normal. Judgment and thought content normal.    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  He also got some Valium to see if we can get it to  reduce on it's own.  We gave this some time and the hernia has reduced.  He is comfortable.  Dr. Dalbert Batman has seen the patient and examined him.  He recommends he be seen at [facility] again and see if they have someone who will consider treating this electively. This is also where his Transplant cardiologist is located and they can provide the best care for his cardiac issues.  We will also advise him and wife that if this reoccurs again and he has to come to the ED here in [place], it would be best to go to [facility] where Cardiology is readily available.  The only other surgeon Dr. Dalbert Batman would recommend would be in [place], [place] who works on complex hernia's.  Raniah Karan 05/03/2015, 10:54 AM

## 2015-05-03 NOTE — Discharge Instructions (Signed)

## 2015-05-03 NOTE — ED Notes (Signed)
Bed: [id number] Expected date:  Expected time:  Means of arrival:  Comments: Ems-abdominal pain

## 2015-05-03 NOTE — ED Notes (Signed)
Per EMS pt comes in for abd pain from hernia.  Pt states that pain started last night around 730pm last night.  Pt did have some nausea and vomiting during the night.

## 2015-06-13 ENCOUNTER — Ambulatory Visit: Payer: Medicare Other | Admitting: Pulmonary Disease

## 2015-07-25 ENCOUNTER — Ambulatory Visit (INDEPENDENT_AMBULATORY_CARE_PROVIDER_SITE_OTHER): Payer: Medicare Other | Admitting: Internal Medicine

## 2015-07-25 ENCOUNTER — Encounter: Payer: Self-pay | Admitting: Internal Medicine

## 2015-07-25 VITALS — BP 124/86 | HR 83 | Ht 74.0 in | Wt 219.0 lb

## 2015-07-25 DIAGNOSIS — G4733 Obstructive sleep apnea (adult) (pediatric): Secondary | ICD-10-CM

## 2015-07-25 NOTE — Patient Instructions (Signed)
Order- contact [facility] for download for CPAP pressure compliance   Dx OSA  Ok to continue CPAP 7/ [facility]  Please call if we can help

## 2015-07-25 NOTE — Progress Notes (Signed)
   Subjective:    Patient ID: Brian Suarez, male    DOB: [birth year], 69 y.o.   MRN: [id number]  HPI  06/02/14- Dr Gwenette Greet The patient comes in today for followup of his obstructive sleep apnea. He is wearing CPAP compliantly by his history, but feels that he is having some breakthrough snoring and possibly breakthrough apnea. He feels that he is not resting quite as well, but admits that he can awaken and not get back to sleep for a few hours. His machine is only 43-32 years old, and his weight is actually down 5 pounds from the last visit. He has not been keeping up with his mask cushion changes as frequently as he needs to.   07/25/15- 69 yoM never smoker followed for OSA complicated by hx heart Xplant/CellCept/cyclosporine FOLLOWS FOR:  Wears CPAP 7 x 6-7 hours per night. Denies problems with mask or pressure. DME [facility]     Review of Systems  Constitutional: Negative for fever and unexpected weight change.  HENT: Negative for congestion, dental problem, ear pain, nosebleeds, postnasal drip, rhinorrhea, sinus pressure, sneezing, sore throat and trouble swallowing.   Eyes: Negative for redness and itching.  Respiratory: Negative for cough, chest tightness, shortness of breath and wheezing.   Cardiovascular: Negative for palpitations and leg swelling.  Gastrointestinal: Negative for nausea and vomiting.  Genitourinary: Negative for dysuria.  Musculoskeletal: Negative for joint swelling.  Skin: Negative for rash.  Neurological: Negative for headaches.  Hematological: Does not bruise/bleed easily.  Psychiatric/Behavioral: Negative for dysphoric mood. The patient is not nervous/anxious.        Objective:   Physical Exam Overweight male in no acute distress Nose without purulence or discharge noted Neck without lymphadenopathy or thyromegaly No skin breakdown or pressure necrosis from the CPAP mask Lower extremities without significant edema, no cyanosis Alert and oriented, moves  all 4 extremities.       Assessment & Plan:

## 2015-07-31 ENCOUNTER — Encounter (HOSPITAL_COMMUNITY): Payer: Self-pay

## 2015-07-31 ENCOUNTER — Emergency Department (HOSPITAL_COMMUNITY)
Admission: EM | Admit: 2015-07-31 | Discharge: 2015-07-31 | Disposition: A | Discharge status: Home / Self Care | Payer: Medicare Other | Attending: Emergency Medicine | Admitting: Emergency Medicine

## 2015-07-31 DIAGNOSIS — R109 Unspecified abdominal pain: Secondary | ICD-10-CM | POA: Diagnosis present

## 2015-07-31 DIAGNOSIS — I129 Hypertensive chronic kidney disease with stage 1 through stage 4 chronic kidney disease, or unspecified chronic kidney disease: Secondary | ICD-10-CM | POA: Insufficient documentation

## 2015-07-31 DIAGNOSIS — M109 Gout, unspecified: Secondary | ICD-10-CM | POA: Insufficient documentation

## 2015-07-31 DIAGNOSIS — E7800 Pure hypercholesterolemia, unspecified: Secondary | ICD-10-CM | POA: Diagnosis not present

## 2015-07-31 DIAGNOSIS — Z79899 Other long term (current) drug therapy: Secondary | ICD-10-CM | POA: Insufficient documentation

## 2015-07-31 DIAGNOSIS — Z8669 Personal history of other diseases of the nervous system and sense organs: Secondary | ICD-10-CM | POA: Insufficient documentation

## 2015-07-31 DIAGNOSIS — Z7982 Long term (current) use of aspirin: Secondary | ICD-10-CM | POA: Insufficient documentation

## 2015-07-31 DIAGNOSIS — Z943 Heart and lungs transplant status: Secondary | ICD-10-CM | POA: Insufficient documentation

## 2015-07-31 DIAGNOSIS — K439 Ventral hernia without obstruction or gangrene: Secondary | ICD-10-CM | POA: Diagnosis not present

## 2015-07-31 DIAGNOSIS — N189 Chronic kidney disease, unspecified: Secondary | ICD-10-CM | POA: Insufficient documentation

## 2015-07-31 DIAGNOSIS — Z9889 Other specified postprocedural states: Secondary | ICD-10-CM | POA: Diagnosis not present

## 2015-07-31 MED ORDER — FENTANYL CITRATE (PF) 100 MCG/2ML IJ SOLN
100.0000 ug | Freq: Once | INTRAMUSCULAR | Status: AC
  Administered 2015-07-31: 100 ug via INTRAVENOUS
  Filled 2015-07-31: qty 2

## 2015-07-31 MED ORDER — SODIUM CHLORIDE 0.9 % IV SOLN
INTRAVENOUS | Status: DC
  Administered 2015-07-31: 22:00:00 via INTRAVENOUS

## 2015-07-31 MED ORDER — ONDANSETRON HCL 4 MG/2ML IJ SOLN
4.0000 mg | Freq: Once | INTRAMUSCULAR | Status: AC
  Administered 2015-07-31: 4 mg via INTRAVENOUS
  Filled 2015-07-31: qty 2

## 2015-07-31 NOTE — ED Provider Notes (Signed)
CSN: [id number]     Arrival date & time 07/31/15  2108 History   First MD Initiated Contact with Patient 07/31/15 2145     Chief Complaint  Patient presents with  . Hernia     (Consider location/radiation/quality/duration/timing/severity/associated sxs/prior Treatment) HPI   Brian Suarez is a 69 y.o. male who presents for evaluation of abdominal pain, vomiting and recurrent hernia. He states that he has a right lateral abdominal wall hernia, that is being evaluated for closure, by his surgeon at [facility]. He has to have the hernia reduced intermittently. Last time was several months ago. He is able to eat today before the hernia recurred. He denies other recent illnesses such as fever, chills, cough, shortness of breath, weakness or dizziness. There are no other known modifying factors.   Past Medical History  Diagnosis Date  . Heart transplanted ([place])   . Gout   . Hypertension   . Hypercholesteremia   . Chronic renal insufficiency   . OSA (obstructive sleep apnea)    Past Surgical History  Procedure Laterality Date  . Cardiac surgery  2005    heart cath  . Hernia repair    . Heart transplant  2002  . Endomyocardial biopsy  2005  . Cystectomy      EPIDERMOID  . Pericardium surgery  2002    BX   . Pericardiocentesis  2010   Family History  Problem Relation Age of Onset  . Throat cancer Father   . Dementia Mother   . Other Mother     HYPOTENSTION   Social History  Substance Use Topics  . Smoking status: Never Smoker   . Smokeless tobacco: Never Used  . Alcohol Use: No    Review of Systems  All other systems reviewed and are negative.     Allergies  Rapamune and Verapamil  Home Medications   Prior to Admission medications   Medication Sig Start Date End Date Taking? Authorizing Provider  allopurinol (ZYLOPRIM) 300 MG tablet Take 300 mg by mouth daily.   Yes Historical Provider, MD  amoxicillin (AMOXIL) 500 MG capsule Take 500 mg by mouth 3 (three) times  daily as needed (for dental procedures).    Yes Historical Provider, MD  aspirin EC 81 MG tablet Take 81 mg by mouth daily.   Yes Historical Provider, MD  atorvastatin (LIPITOR) 40 MG tablet Take 40 mg by mouth daily.   Yes Historical Provider, MD  calcium-vitamin D (OSCAL WITH D) 500-200 MG-UNIT per tablet Take 1 tablet by mouth daily.   Yes Historical Provider, MD  carvedilol (COREG) 25 MG tablet Take 25 mg by mouth 2 (two) times daily with a meal.   Yes Historical Provider, MD  colchicine 0.6 MG tablet Take 0.6 mg by mouth as needed (for gout).    Yes Historical Provider, MD  cycloSPORINE modified (GENGRAF) 100 MG capsule Take 100 mg by mouth 2 (two) times daily.   Yes Historical Provider, MD  fish oil-omega-3 fatty acids 1000 MG capsule Take 2 g by mouth 2 (two) times daily.    Yes Historical Provider, MD  lisinopril (PRINIVIL,ZESTRIL) 20 MG tablet Take 20 mg by mouth daily.   Yes Historical Provider, MD  magnesium oxide (MAG-OX) 400 MG tablet Take 400 mg by mouth daily.   Yes Historical Provider, MD  Multiple Vitamin (MULTIVITAMIN WITH MINERALS) TABS Take 1 tablet by mouth daily.   Yes Historical Provider, MD  mycophenolate (CELLCEPT) 500 MG tablet Take 1,500 mg by mouth 2 (  two) times daily.   Yes Historical Provider, MD   BP 176/109 mmHg  Pulse 84  Temp(Src) 97.8 F (36.6 C) (Oral)  Resp 20  SpO2 99% Physical Exam  Constitutional: He is oriented to person, place, and time. He appears well-developed.  Elderly, frail  HENT:  Head: Normocephalic and atraumatic.  Right Ear: External ear normal.  Left Ear: External ear normal.  Eyes: Conjunctivae and EOM are normal. Pupils are equal, round, and reactive to light.  Neck: Normal range of motion and phonation normal. Neck supple.  Cardiovascular: Normal rate.   Pulmonary/Chest: Effort normal. He exhibits no bony tenderness.  Abdominal: Soft.  Midline abdominal wall hernia associated with an incision, which is nontender and not involved  with his current discomfort. Right lateral lower abdominal wall with a 3 x 5 cm mass consistent with hernia, relatively deep, tender to palpation, and not immediately reducible, with attempts at reduction, done without medication.  Musculoskeletal: Normal range of motion.  Neurological: He is alert and oriented to person, place, and time. No cranial nerve deficit or sensory deficit. He exhibits normal muscle tone. Coordination normal.  Skin: Skin is warm, dry and intact.  Psychiatric: He has a normal mood and affect. His behavior is normal. Judgment and thought content normal.  Nursing note and vitals reviewed.   ED Course  Procedures (including critical care time)  Medications - No data to display  Patient Vitals for the past 24 hrs:  BP Temp Temp src Pulse Resp SpO2  07/31/15 2117 (!) 176/109 mmHg 97.8 F (36.6 C) Oral 84 20 99 %    20:45- second attempt to reduce hernia after pain medication, mass was decompressed about 20-30%, but not eliminated. Patient tolerated this well. He states that the area feels "less tight". There's been no vomiting here. He feels well enough to go home.  10:59 PM Reevaluation with update and discussion. After initial assessment and treatment, an updated evaluation reveals no change in clinical status. [place] Review Labs Reviewed - No data to display  Imaging Review No results found. I have personally reviewed and evaluated these images and lab results as part of my medical decision-making.   EKG Interpretation None      MDM   Final diagnoses:  Abdominal wall hernia    Recurrent abdominal wall hernia, with a single episode of vomiting. He is planning on surgical reduction, at [place] bowel obstruction, cellulitis, or metabolic instability.  Nursing Notes Reviewed/ Care Coordinated Applicable Imaging Reviewed Interpretation of Laboratory Data incorporated into ED treatment  The patient appears reasonably  screened and/or stabilized for discharge and I doubt any other medical condition or other [facility] requiring further screening, evaluation, or treatment in the ED at this time prior to discharge.  Plan: Home Medications- none; Home Treatments- rest; return here if the recommended treatment, does not improve the symptoms; Recommended follow up- Surgery, phone contact in AM     Daleen Bo, MD 07/31/15 2303

## 2015-07-31 NOTE — ED Notes (Signed)
Pt arrived via EMS from home c/o lower right side abdominal hernia.  Pt states it has increased in swelling and is palpable, pt has had it reduced in the ED before.

## 2015-07-31 NOTE — ED Notes (Signed)
Pt stable, ambulatory, states understanding of discharge instructions 

## 2015-07-31 NOTE — Discharge Instructions (Signed)
Call your doctor, at [facility], tomorrow to discuss the hernia problem. Start with a clear liquid diet tonight and gradually advance tomorrow. Watch for vomiting, increasing pain, weakness and dizziness. Return here if needed, for problems.    Hernia, Adult A hernia is the bulging of an organ or tissue through a weak spot in the muscles of the abdomen (abdominal wall). Hernias develop most often near the navel or groin. There are many kinds of hernias. Common kinds include:  Femoral hernia. This kind of hernia develops under the groin in the upper thigh area.  Inguinal hernia. This kind of hernia develops in the groin or scrotum.  Umbilical hernia. This kind of hernia develops near the navel.  Hiatal hernia. This kind of hernia causes part of the stomach to be pushed up into the chest.  Incisional hernia. This kind of hernia bulges through a scar from an abdominal surgery. CAUSES This condition may be caused by:  Heavy lifting.  Coughing over a long period of time.  Straining to have a bowel movement.  An incision made during an abdominal surgery.  A birth defect (congenital defect).  Excess weight or obesity.  Smoking.  Poor nutrition.  Cystic fibrosis.  Excess fluid in the abdomen.  Undescended testicles. SYMPTOMS Symptoms of a hernia include:  A lump on the abdomen. This is the first sign of a hernia. The lump may become more obvious with standing, straining, or coughing. It may get bigger over time if it is not treated or if the condition causing it is not treated.  Pain. A hernia is usually painless, but it may become painful over time if treatment is delayed. The pain is usually dull and may get worse with standing or lifting heavy objects. Sometimes a hernia gets tightly squeezed in the weak spot (strangulated) or stuck there (incarcerated) and causes additional symptoms. These symptoms may  include:  Vomiting.  Nausea.  Constipation.  Irritability. DIAGNOSIS A hernia may be diagnosed with:  A physical exam. During the exam your health care provider may ask you to cough or to make a specific movement, because a hernia is usually more visible when you move.  Imaging tests. These can include:  X-rays.  Ultrasound.  CT scan. TREATMENT A hernia that is small and painless may not need to be treated. A hernia that is large or painful may be treated with surgery. Inguinal hernias may be treated with surgery to prevent incarceration or strangulation. Strangulated hernias are always treated with surgery, because lack of blood to the trapped organ or tissue can cause it to die. Surgery to treat a hernia involves pushing the bulge back into place and repairing the weak part of the abdomen. HOME CARE INSTRUCTIONS  Avoid straining.  Do not lift anything heavier than 10 lb (4.5 kg).  Lift with your leg muscles, not your back muscles. This helps avoid strain.  When coughing, try to cough gently.  Prevent constipation. Constipation leads to straining with bowel movements, which can make a hernia worse or cause a hernia repair to break down. You can prevent constipation by:  Eating a high-fiber diet that includes plenty of fruits and vegetables.  Drinking enough fluids to keep your urine clear or pale yellow. Aim to drink 6-8 glasses of water per day.  Using a stool softener as directed by your health care provider.  Lose weight, if you are overweight.  Do not use any tobacco products, including cigarettes, chewing tobacco, or electronic cigarettes. If you need  help quitting, ask your health care provider.  Keep all follow-up visits as directed by your health care provider. This is important. Your health care provider may need to monitor your condition. SEEK MEDICAL CARE IF:  You have swelling, redness, and pain in the affected area.  Your bowel habits change. SEEK  IMMEDIATE MEDICAL CARE IF:  You have a fever.  You have abdominal pain that is getting worse.  You feel nauseous or you vomit.  You cannot push the hernia back in place by gently pressing on it while you are lying down.  The hernia:  Changes in shape or size.  Is stuck outside the abdomen.  Becomes discolored.  Feels hard or tender.   This information is not intended to replace advice given to you by your health care provider. Make sure you discuss any questions you have with your health care provider.   Document Released: 08/13/2005 Document Revised: 09/03/2014 Document Reviewed: 06/23/2014 Elsevier Interactive Patient Education [employer].

## 2016-02-10 ENCOUNTER — Emergency Department (HOSPITAL_COMMUNITY)
Admission: EM | Admit: 2016-02-10 | Discharge: 2016-02-11 | Disposition: A | Discharge status: Other Acute Inpatient Hospital | Payer: Medicare Other | Attending: Emergency Medicine | Admitting: Emergency Medicine

## 2016-02-10 ENCOUNTER — Encounter (HOSPITAL_COMMUNITY): Payer: Self-pay | Admitting: Vascular Surgery

## 2016-02-10 DIAGNOSIS — N179 Acute kidney failure, unspecified: Secondary | ICD-10-CM | POA: Insufficient documentation

## 2016-02-10 DIAGNOSIS — Z7982 Long term (current) use of aspirin: Secondary | ICD-10-CM | POA: Insufficient documentation

## 2016-02-10 DIAGNOSIS — I959 Hypotension, unspecified: Secondary | ICD-10-CM | POA: Insufficient documentation

## 2016-02-10 DIAGNOSIS — I129 Hypertensive chronic kidney disease with stage 1 through stage 4 chronic kidney disease, or unspecified chronic kidney disease: Secondary | ICD-10-CM | POA: Diagnosis not present

## 2016-02-10 DIAGNOSIS — N189 Chronic kidney disease, unspecified: Secondary | ICD-10-CM | POA: Insufficient documentation

## 2016-02-10 DIAGNOSIS — R112 Nausea with vomiting, unspecified: Secondary | ICD-10-CM

## 2016-02-10 DIAGNOSIS — Z941 Heart transplant status: Secondary | ICD-10-CM | POA: Insufficient documentation

## 2016-02-10 DIAGNOSIS — D649 Anemia, unspecified: Secondary | ICD-10-CM | POA: Insufficient documentation

## 2016-02-10 DIAGNOSIS — E875 Hyperkalemia: Secondary | ICD-10-CM | POA: Insufficient documentation

## 2016-02-10 LAB — TYPE AND SCREEN
ABO/RH(D): A POS
Antibody Screen: NEGATIVE

## 2016-02-10 LAB — CBC WITH DIFFERENTIAL/PLATELET
BASOS ABS: 0 10*3/uL (ref 0.0–0.1)
BASOS PCT: 0 %
EOS ABS: 0.1 10*3/uL (ref 0.0–0.7)
EOS PCT: 1 %
HCT: 25 % — ABNORMAL LOW (ref 39.0–52.0)
HEMOGLOBIN: 7.8 g/dL — AB (ref 13.0–17.0)
LYMPHS ABS: 0.6 10*3/uL — AB (ref 0.7–4.0)
Lymphocytes Relative: 7 %
MCH: 27.7 pg (ref 26.0–34.0)
MCHC: 31.2 g/dL (ref 30.0–36.0)
MCV: 88.7 fL (ref 78.0–100.0)
Monocytes Absolute: 0.6 10*3/uL (ref 0.1–1.0)
Monocytes Relative: 8 %
NEUTROS PCT: 84 %
Neutro Abs: 7.2 10*3/uL (ref 1.7–7.7)
Platelets: 153 10*3/uL (ref 150–400)
RBC: 2.82 MIL/uL — AB (ref 4.22–5.81)
RDW: 14.3 % (ref 11.5–15.5)
WBC: 8.5 10*3/uL (ref 4.0–10.5)

## 2016-02-10 LAB — BASIC METABOLIC PANEL
Anion gap: 11 (ref 5–15)
BUN: 93 mg/dL — ABNORMAL HIGH (ref 6–20)
CALCIUM: 9.1 mg/dL (ref 8.9–10.3)
CO2: 19 mmol/L — ABNORMAL LOW (ref 22–32)
CREATININE: 4.62 mg/dL — AB (ref 0.61–1.24)
Chloride: 104 mmol/L (ref 101–111)
GFR calc Af Amer: 14 mL/min — ABNORMAL LOW (ref >60)
GFR calc non Af Amer: 12 mL/min — ABNORMAL LOW (ref >60)
Glucose, Bld: 116 mg/dL — ABNORMAL HIGH (ref 65–99)
Potassium: 5.6 mmol/L — ABNORMAL HIGH (ref 3.5–5.1)
SODIUM: 134 mmol/L — AB (ref 135–145)

## 2016-02-10 LAB — ABO/RH: ABO/RH(D): A POS

## 2016-02-10 MED ORDER — CARVEDILOL 12.5 MG PO TABS
25.0000 mg | ORAL_TABLET | Freq: Two times a day (BID) | ORAL | Status: DC
  Administered 2016-02-10: 25 mg via ORAL
  Filled 2016-02-10: qty 2

## 2016-02-10 MED ORDER — SODIUM CHLORIDE 0.9 % IV BOLUS (SEPSIS)
500.0000 mL | Freq: Once | INTRAVENOUS | Status: AC
  Administered 2016-02-10: 500 mL via INTRAVENOUS

## 2016-02-10 MED ORDER — SODIUM CHLORIDE 0.9 % IV SOLN
INTRAVENOUS | Status: DC
  Administered 2016-02-10: 15:00:00 via INTRAVENOUS

## 2016-02-10 MED ORDER — MYCOPHENOLATE MOFETIL 250 MG PO CAPS
1500.0000 mg | ORAL_CAPSULE | Freq: Two times a day (BID) | ORAL | Status: DC
  Administered 2016-02-10: 1500 mg via ORAL
  Filled 2016-02-10: qty 6

## 2016-02-10 MED ORDER — SODIUM CHLORIDE 0.9 % IV BOLUS (SEPSIS): 500.0000 mL | Freq: Once | INTRAVENOUS | Status: DC

## 2016-02-10 MED ORDER — CYCLOSPORINE MODIFIED (NEORAL) 100 MG PO CAPS
100.0000 mg | ORAL_CAPSULE | Freq: Two times a day (BID) | ORAL | Status: DC
  Administered 2016-02-10: 100 mg via ORAL
  Filled 2016-02-10: qty 1

## 2016-02-10 MED ORDER — ONDANSETRON HCL 4 MG/2ML IJ SOLN
4.0000 mg | Freq: Once | INTRAMUSCULAR | Status: AC
  Administered 2016-02-10: 4 mg via INTRAVENOUS
  Filled 2016-02-10: qty 2

## 2016-02-10 NOTE — ED Notes (Signed)
Pt presents from Dr. Julianne Rice office with report of 5 week h/o nausea, vomiting and diarrhea.  Pt is s/p umbilical hernia repair; reports 23 pound weight loss since surgery.  Pt was hypotensive at MD office:  70/52 with 400 mL of NS infused to 20g to R wrist.

## 2016-02-10 NOTE — ED Notes (Signed)
Pt reports to the ED for eval of generalized weakness, fatigue, decreased appetite, weight loss (23 lbs), and emesis since his hernia surgery in May. Pt followed up with his PCP and was found to be hypotensive in the [id number] systolic. Surgical site appears well healed. No obvious s/s of surgical site infection. Pt also reports some diarrhea as well. Denies any abd pain or fevers. He does have occasional chills. Pt A&Ox4, resp e/u, and skin warm, pale, and dry.

## 2016-02-10 NOTE — ED Provider Notes (Addendum)
CSN: [id number]     Arrival date & time 02/10/16  1321 History   First MD Initiated Contact with Patient 02/10/16 1435     Chief Complaint  Patient presents with  . Emesis  . Hypotension      HPI  Expand All Collapse All   Pt presents from Dr. Julianne Rice office with report of 5 week h/o nausea, vomiting and diarrhea. Pt is s/p umbilical hernia repair; reports 23 pound weight loss since surgery. Pt was hypotensive at MD office: 70/52 with 400 mL of NS infused to 20g to R wrist.        Past Medical History  Diagnosis Date  . 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 Gout   . Hypertension   . Hypercholesteremia   . Chronic renal insufficiency   . OSA (obstructive sleep apnea)    Past Surgical History  Procedure Laterality Date  . Cardiac surgery  2005    heart cath  . Hernia repair    . Heart transplant  2002  . Endomyocardial biopsy  2005  . Cystectomy      EPIDERMOID  . Pericardium surgery  2002    BX   . Pericardiocentesis  2010  . Hernia repair     Family History  Problem Relation Age of Onset  . Throat cancer Father   . Dementia Mother   . Other Mother     HYPOTENSTION   Social History  Substance Use Topics  . Smoking status: Never Smoker   . Smokeless tobacco: Never Used  . Alcohol Use: No    Review of Systems  Gastrointestinal: Positive for nausea, vomiting and diarrhea. Negative for blood in stool and rectal pain.      Allergies  Rapamune and Verapamil  Home Medications   Prior to Admission medications   Medication Sig Start Date End Date Taking? Authorizing Provider  allopurinol (ZYLOPRIM) 300 MG tablet Take 300 mg by mouth daily.   Yes Historical Provider, MD  aspirin EC 81 MG tablet Take 81 mg by mouth daily.   Yes Historical Provider, MD  atorvastatin (LIPITOR) 40 MG tablet Take 40 mg by mouth daily.   Yes Historical Provider, MD  calcium-vitamin D (OSCAL WITH D) 500-200 MG-UNIT per tablet Take 1 tablet by mouth daily.   Yes Historical Provider, MD   carvedilol (COREG) 25 MG tablet Take 25 mg by mouth 2 (two) times daily with a meal.   Yes Historical Provider, MD  colchicine 0.6 MG tablet Take 0.6 mg by mouth daily as needed (for gout).    Yes Historical Provider, MD  cycloSPORINE modified (GENGRAF) 100 MG capsule Take 100 mg by mouth 2 (two) times daily.   Yes Historical Provider, MD  fish oil-omega-3 fatty acids 1000 MG capsule Take 4 g by mouth daily.    Yes Historical Provider, MD  lisinopril (PRINIVIL,ZESTRIL) 20 MG tablet Take 20 mg by mouth daily.   Yes Historical Provider, MD  magnesium oxide (MAG-OX) 400 MG tablet Take 400 mg by mouth daily.   Yes Historical Provider, MD  Multiple Vitamin (MULTIVITAMIN WITH MINERALS) TABS Take 1 tablet by mouth daily.   Yes Historical Provider, MD  mycophenolate (CELLCEPT) 500 MG tablet Take 1,500 mg by mouth 2 (two) times daily.   Yes Historical Provider, MD   BP 125/82 mmHg  Pulse 70  Temp(Src) 97.4 F (36.3 C) (Oral)  Resp 21  SpO2 100% Physical Exam Physical Exam  Nursing note and vitals reviewed. Constitutional: He is oriented to  person, place, and time. He appears well-developed and well-nourished. No distress.  HENT:  Head: Normocephalic and atraumatic.  Eyes: Pupils are equal, round, and reactive to light.  Neck: Normal range of motion.  Cardiovascular: Normal rate and intact distal pulses.   Pulmonary/Chest: No respiratory distress.  Abdominal: Normal appearance.  Large umbilical hernia incision which looks well-healed.  Musculoskeletal: Normal range of motion.  Neurological: He is alert and oriented to person, place, and time. No cranial nerve deficit.  Skin: Skin is warm and dry. No rash noted.  Psychiatric: He has a normal mood and affect. His behavior is normal.   ED Course  Procedures (including critical care time) Labs Review Labs Reviewed  CBC WITH DIFFERENTIAL/PLATELET - Abnormal; Notable for the following:    RBC 2.82 (*)    Hemoglobin 7.8 (*)    HCT 25.0 (*)     Lymphs Abs 0.6 (*)    All other components within normal limits  BASIC METABOLIC PANEL - Abnormal; Notable for the following:    Sodium 134 (*)    Potassium 5.6 (*)    CO2 19 (*)    Glucose, Bld 116 (*)    BUN 93 (*)    Creatinine, Ser 4.62 (*)    GFR calc non Af Amer 12 (*)    GFR calc Af Amer 14 (*)    All other components within normal limits  TYPE AND SCREEN    Imaging Review No results found.  I received a call from the patient's gastroenterology surgeon at [facility].  They requested the patient be transferred once stabilized to [facility].  The family and patient were informed of this and they agree that's where they want to go.  Patient appears stable for transfer.  Transfer center has been contacted and will call when a monitor bed is available.   MDM   Final diagnoses:  Acute on chronic renal failure (HCC)  Hyperkalemia  Non-intractable vomiting with nausea, vomiting of unspecified type  Transient hypotension  Anemia, unspecified anemia type          Leonard Schwartz, MD 02/10/16 1620

## 2016-02-10 NOTE — ED Notes (Signed)
Report given to Gabe, RN 

## 2016-02-10 NOTE — ED Notes (Signed)
PT  C/o nausea at this time.

## 2016-02-11 DIAGNOSIS — D649 Anemia, unspecified: Secondary | ICD-10-CM | POA: Diagnosis not present

## 2016-02-11 DIAGNOSIS — I959 Hypotension, unspecified: Secondary | ICD-10-CM | POA: Diagnosis not present

## 2016-02-11 DIAGNOSIS — Z941 Heart transplant status: Secondary | ICD-10-CM | POA: Diagnosis not present

## 2016-02-11 DIAGNOSIS — R112 Nausea with vomiting, unspecified: Secondary | ICD-10-CM | POA: Diagnosis present

## 2016-02-11 DIAGNOSIS — E875 Hyperkalemia: Secondary | ICD-10-CM | POA: Diagnosis not present

## 2016-02-11 DIAGNOSIS — N179 Acute kidney failure, unspecified: Secondary | ICD-10-CM | POA: Diagnosis not present

## 2016-02-11 DIAGNOSIS — N189 Chronic kidney disease, unspecified: Secondary | ICD-10-CM | POA: Diagnosis not present

## 2016-02-11 DIAGNOSIS — I129 Hypertensive chronic kidney disease with stage 1 through stage 4 chronic kidney disease, or unspecified chronic kidney disease: Secondary | ICD-10-CM | POA: Diagnosis not present

## 2016-02-11 DIAGNOSIS — Z7982 Long term (current) use of aspirin: Secondary | ICD-10-CM | POA: Diagnosis not present

## 2016-02-11 LAB — CBG MONITORING, ED: GLUCOSE-CAPILLARY: 92 mg/dL (ref 65–99)

## 2016-02-11 NOTE — ED Notes (Signed)
Pt transferred to [facility] via [place]. Report called to receiving RN. Pt a/o x 4. NAD noted.

## 2016-05-11 ENCOUNTER — Ambulatory Visit: Payer: Medicare Other | Attending: Internal Medicine | Admitting: Physical Therapy

## 2016-05-11 ENCOUNTER — Encounter: Payer: Self-pay | Admitting: Physical Therapy

## 2016-05-11 DIAGNOSIS — M6281 Muscle weakness (generalized): Secondary | ICD-10-CM | POA: Insufficient documentation

## 2016-05-11 DIAGNOSIS — R262 Difficulty in walking, not elsewhere classified: Secondary | ICD-10-CM | POA: Insufficient documentation

## 2016-05-11 NOTE — Therapy (Signed)
[address] [place], [place], [address] Phone: [phone]   Fax:  [phone]  Physical Therapy Evaluation  Patient Details  Name: NATASHA PAULSON MRN: [id number] Date of Birth: 09/10/1945 Referring Provider: Jani Gravel  Encounter Date: 05/11/2016      PT End of Session - 05/11/16 1046    Visit Number 1   Date for PT Re-Evaluation 07/11/16   PT Start Time 1006   PT Stop Time 1055   PT Time Calculation (min) 49 min   Activity Tolerance Patient tolerated treatment well   Behavior During Therapy [facility] for tasks assessed/performed      Past Medical History:  Diagnosis Date  . Chronic renal insufficiency   . Gout   . 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 Hypercholesteremia   . Hypertension   . OSA (obstructive sleep apnea)     Past Surgical History:  Procedure Laterality Date  . CARDIAC SURGERY  2005   heart cath  . CYSTECTOMY     EPIDERMOID  . ENDOMYOCARDIAL BIOPSY  2005  . HEART TRANSPLANT  2002  . HERNIA REPAIR    . HERNIA REPAIR    . PERICARDIOCENTESIS  2010  . PERICARDIUM SURGERY  2002   BX     There were no vitals filed for this visit.       Subjective Assessment - 05/11/16 1010    Subjective Patient reports that he had a hernia surgery in May, then a urinary tract infection that really has "zapped me", reports that he lost over 20# and was in the hospital 20 days.  He had a heart transplant in 2002.  Patient had home PT until August 28th .     Limitations Walking   Patient Stated Goals walk better, feel better, feel stronger   Currently in Pain? No/denies            [facility] PT Assessment - 05/11/16 0001      Assessment   Medical Diagnosis weakness, debility   Referring Provider Jani Gravel   Onset Date/Surgical Date 03/10/16   Prior Therapy home PT     Precautions   Precautions None     Balance Screen   Has the patient fallen in the past 6 months Yes   How many times? 4   Has  the patient had a decrease in activity level because of a fear of falling?  Yes   Is the patient reluctant to leave their home because of a fear of falling?  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   Leisure no exercise     ROM / Strength   AROM / PROM / Strength AROM;Strength     AROM   Overall AROM Comments WFL's for the UE and LE's     Strength   Overall Strength Comments UE strength 4/5, 3+/5 for the hips, 4-/5 for the knees and ankles     Ambulation/Gait   Gait Comments uses a FWW, slow gait, fatigues and needs rest at 100 feet, reports has had to use a w/c at time     Standardized Balance Assessment   Standardized Balance Assessment Merrilee Jansky  Balance Test;Timed Up and Go Test     Berg Balance Test   Sit to Stand Able to stand  independently using hands   Standing Unsupported Able to stand 2 minutes with supervision   Sitting with Back Unsupported but Feet Supported on Floor or Stool Able to sit safely and securely 2 minutes   Stand to Sit Controls descent by using hands   Transfers Able to transfer safely, definite need of hands   Standing Unsupported with Eyes Closed Able to stand 10 seconds with supervision   Standing Ubsupported with Feet Together Able to place feet together independently and stand for 1 minute with supervision   From Standing, Reach Forward with Outstretched Arm Can reach forward >12 cm safely (5")   From Standing Position, Pick up Object from [place] to pick up shoe, needs supervision   From Standing Position, Turn to Look Behind Over each Shoulder Looks behind one side only/other side shows less weight shift   Turn 360 Degrees Able to turn 360 degrees safely one side only in 4 seconds or less    Standing Unsupported, Alternately Place Feet on Step/Stool Able to complete 4 steps without aid or supervision   Standing Unsupported, One Foot in Front Able to take small step independently and hold 30 seconds   Standing on One Leg Tries to lift leg/unable to hold 3 seconds but remains standing independently   Total Score 39     Timed Up and Go Test   Normal TUG (seconds) 19                   [facility] Adult PT Treatment/Exercise - 05/11/16 0001      Exercises   Exercises Knee/Hip     Knee/Hip Exercises: Aerobic   Nustep level 4 x 5 minutes     Knee/Hip Exercises: Machines for Strengthening   Cybex Knee Extension 5# 2x10   Cybex Knee Flexion 20# 2x10                  PT Short Term Goals - 05/11/16 1051      PT SHORT TERM GOAL #1   Title independent with safety and falls management   Time 2   Period Weeks   Status New           PT Long Term Goals - 05/11/16 1051      PT LONG TERM GOAL #1   Title independent with HEP   Time 8   Period Weeks   Status New     PT LONG TERM GOAL #2   Title walk 150 feet without device   Time 8   Period Weeks   Status New     PT LONG TERM GOAL #3   Title increase Berg balance test to 46/56   Time 8   Period Weeks   Status New     PT LONG TERM GOAL #4   Title increase hip strength to 4/5   Time 8   Period Weeks   Status New     PT LONG TERM GOAL #5   Title tolerate 45 minutes of activity without rest   Time 8   Period Weeks   Status New               Plan - 05/11/16 1047    Clinical Impression Statement Patient had hernia surgery in May, he then experienced a UTI that caused him to be hospitalized 20 days, he lost 20#, he  has had 4 falls over the past 4 months.  He is very weak, Berg balance was 39/56   Rehab Potential Good   PT Frequency 2x / week   PT Duration 8 weeks   PT Treatment/Interventions ADLs/Self Care Home Management;Gait training;Functional mobility training;Therapeutic  activities;Therapeutic exercise;Neuromuscular re-education;Patient/family education   PT Next Visit Plan slowly advance exercises   Consulted and Agree with Plan of Care Patient      Patient will benefit from skilled therapeutic intervention in order to improve the following deficits and impairments:  Cardiopulmonary status limiting activity, Decreased activity tolerance, Decreased balance, Decreased mobility, Decreased endurance, Decreased strength, Difficulty walking  Visit Diagnosis: Muscle weakness (generalized) - Plan: PT plan of care cert/re-cert  Difficulty in walking, not elsewhere classified - Plan: PT plan of care cert/re-cert      G-Codes - 51/88/41 1054    Functional Assessment Tool Used foto 65% limitation   Functional Limitation Other PT primary   Other PT Primary Current Status (Y6063) At least 60 percent but less than 80 percent impaired, limited or restricted   Other PT Primary Goal Status (K1601) At least 40 percent but less than 60 percent impaired, limited or restricted       Problem List Patient Active Problem List   Diagnosis Date Noted  . OSA (obstructive sleep apnea) 05/29/2012    Sumner Boast., PT 05/11/2016, 11:04 AM  [address] [place], [place], [address] Phone: [phone]   Fax:  [phone]  Name: ILYAS LIPSITZ MRN: [id number] Date of Birth: November 24, 1945

## 2016-05-15 ENCOUNTER — Ambulatory Visit: Payer: Medicare Other | Admitting: Physical Therapy

## 2016-05-15 ENCOUNTER — Encounter: Payer: Self-pay | Admitting: Physical Therapy

## 2016-05-15 DIAGNOSIS — R262 Difficulty in walking, not elsewhere classified: Secondary | ICD-10-CM

## 2016-05-15 DIAGNOSIS — M6281 Muscle weakness (generalized): Secondary | ICD-10-CM | POA: Diagnosis not present

## 2016-05-15 NOTE — Therapy (Signed)
[address] [place], [place], [address] Phone: [phone]   Fax:  [phone]  Physical Therapy Treatment  Patient Details  Name: Brian Suarez MRN: [id number] Date of Birth: Jun 12, 1946 Referring Provider: Jani Gravel  Encounter Date: 05/15/2016      PT End of Session - 05/15/16 1204    Visit Number 2   Date for PT Re-Evaluation 07/11/16   PT Start Time 1140   PT Stop Time 1226   PT Time Calculation (min) 46 min      Past Medical History:  Diagnosis Date  . Chronic renal insufficiency   . Gout   . 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 Hypercholesteremia   . Hypertension   . OSA (obstructive sleep apnea)     Past Surgical History:  Procedure Laterality Date  . CARDIAC SURGERY  2005   heart cath  . CYSTECTOMY     EPIDERMOID  . ENDOMYOCARDIAL BIOPSY  2005  . HEART TRANSPLANT  2002  . HERNIA REPAIR    . HERNIA REPAIR    . PERICARDIOCENTESIS  2010  . PERICARDIUM SURGERY  2002   BX     There were no vitals filed for this visit.      Subjective Assessment - 05/15/16 1144    Subjective amb in with RW but barely using and amb some without AD   Currently in Pain? No/denies                         [facility] Adult PT Treatment/Exercise - 05/15/16 0001      Knee/Hip Exercises: Aerobic   Nustep L 5 6 min   Other Aerobic UBE L 2 2 fwd/2 back     Knee/Hip Exercises: Machines for Strengthening   Cybex Knee Extension 5# 3x10   Cybex Knee Flexion 20# 3x10   Cybex Leg Press 20# 3 sets 10   Other Machine lat pull and seated row 2 sets 10 15#     Knee/Hip Exercises: Standing   Heel Raises 15 reps  3#   Other Standing Knee Exercises 3# with RW hip 3 way and marching 10 reps each     Knee/Hip Exercises: Seated   Ball Squeeze 20x   Abduction/Adduction  Both;2 sets;10 reps  red tband                  PT Short Term Goals - 05/11/16 1051      PT SHORT TERM GOAL #1   Title  independent with safety and falls management   Time 2   Period Weeks   Status New           PT Long Term Goals - 05/11/16 1051      PT LONG TERM GOAL #1   Title independent with HEP   Time 8   Period Weeks   Status New     PT LONG TERM GOAL #2   Title walk 150 feet without device   Time 8   Period Weeks   Status New     PT LONG TERM GOAL #3   Title increase Berg balance test to 46/56   Time 8   Period Weeks   Status New     PT LONG TERM GOAL #4   Title increase hip strength to 4/5   Time 8   Period Weeks   Status New     PT LONG TERM GOAL #5  Title tolerate 45 minutes of activity without rest   Time 8   Period Weeks   Status New               Plan - 05/15/16 1210    Clinical Impression Statement tolerated ther ex interventions well,shaking noted in LE with SLS of standing leg ex. pt needed cuing for speed and control of mvmt.   PT Next Visit Plan assses how felt after todays session and progress as tolerated      Patient will benefit from skilled therapeutic intervention in order to improve the following deficits and impairments:  Cardiopulmonary status limiting activity, Decreased activity tolerance, Decreased balance, Decreased mobility, Decreased endurance, Decreased strength, Difficulty walking  Visit Diagnosis: Muscle weakness (generalized)  Difficulty in walking, not elsewhere classified     Problem List Patient Active Problem List   Diagnosis Date Noted  . OSA (obstructive sleep apnea) 05/29/2012    Maecy Podgurski,ANGIE PTA 05/15/2016, 12:11 PM  [address] [place], [place], [address] Phone: [phone]   Fax:  [phone]  Name: Brian Suarez MRN: [id number] Date of Birth: 1945-08-30

## 2016-05-16 ENCOUNTER — Encounter: Payer: Self-pay | Admitting: Physical Therapy

## 2016-05-16 ENCOUNTER — Ambulatory Visit: Payer: Medicare Other | Admitting: Physical Therapy

## 2016-05-16 DIAGNOSIS — M6281 Muscle weakness (generalized): Secondary | ICD-10-CM

## 2016-05-16 DIAGNOSIS — R262 Difficulty in walking, not elsewhere classified: Secondary | ICD-10-CM

## 2016-05-16 NOTE — Therapy (Signed)
[address] [place], [place], [address] Phone: [phone]   Fax:  [phone]  Physical Therapy Treatment  Patient Details  Name: Brian Suarez MRN: [id number] Date of Birth: [birth year] Referring Provider: Jani Gravel  Encounter Date: 05/16/2016      PT End of Session - 05/16/16 1228    Visit Number 3   Date for PT Re-Evaluation 07/11/16   PT Start Time 1145   PT Stop Time 1228   PT Time Calculation (min) 43 min   Activity Tolerance Patient tolerated treatment well   Behavior During Therapy [facility] for tasks assessed/performed      Past Medical History:  Diagnosis Date  . Chronic renal insufficiency   . Gout   . Heart transplanted ([place])   . Hypercholesteremia   . Hypertension   . OSA (obstructive sleep apnea)     Past Surgical History:  Procedure Laterality Date  . CARDIAC SURGERY  2005   heart cath  . CYSTECTOMY     EPIDERMOID  . ENDOMYOCARDIAL BIOPSY  2005  . HEART TRANSPLANT  2002  . HERNIA REPAIR    . HERNIA REPAIR    . PERICARDIOCENTESIS  2010  . PERICARDIUM SURGERY  2002   BX     There were no vitals filed for this visit.      Subjective Assessment - 05/16/16 1148    Subjective "My leg is a little sore"   Currently in Pain? Yes   Pain Score 1    Pain Location Leg   Pain Orientation Right                         [facility] Adult PT Treatment/Exercise - 05/16/16 0001      Knee/Hip Exercises: Aerobic   Nustep L 5 6 min   Other Aerobic UBE L 2 2 fwd/2 back     Knee/Hip Exercises: Machines for Strengthening   Cybex Knee Extension 5# 3x10   Cybex Knee Flexion 25# 3x10   Cybex Leg Press 30# 3 sets 10   Other Machine lat pull and seated row 20lb 2x10      Knee/Hip Exercises: Seated   Ball Squeeze 20x   Abduction/Adduction  Both;2 sets;10 reps   Abd/Adduction Limitations Blue Tband   Sit to [employer] 2 sets;10 reps;with UE support;without UE support  Seated on  airex pad second set no UE support                   PT Short Term Goals - 05/11/16 1051      PT SHORT TERM GOAL #1   Title independent with safety and falls management   Time 2   Period Weeks   Status New           PT Long Term Goals - 05/16/16 1231      PT LONG TERM GOAL #2   Title walk 150 feet without device   Status On-going     PT LONG TERM GOAL #3   Title increase Berg balance test to 46/56   Status On-going     PT LONG TERM GOAL #4   Title increase hip strength to 4/5   Status On-going     PT LONG TERM GOAL #5   Title tolerate 45 minutes of activity without rest   Status On-going               Plan - 05/16/16  1228    Clinical Impression Statement Pt with good carryover from previous treatment session. Some forward momentum needed to complete sit to stands with UE support. Second set of sit to stand performed from an elevated surface and needed a rest break in the middle of set. During leg press LLE appears to be bigger than R.  Pt able to ambulate around clinic between machines without AD.   Rehab Potential Good   PT Frequency 2x / week   PT Duration 8 weeks   PT Treatment/Interventions ADLs/Self Care Home Management;Gait training;Functional mobility training;Therapeutic activities;Therapeutic exercise;Neuromuscular re-education;Patient/family education   PT Next Visit Plan progress as tolerated      Patient will benefit from skilled therapeutic intervention in order to improve the following deficits and impairments:  Cardiopulmonary status limiting activity, Decreased activity tolerance, Decreased balance, Decreased mobility, Decreased endurance, Decreased strength, Difficulty walking  Visit Diagnosis: Difficulty in walking, not elsewhere classified  Muscle weakness (generalized)     Problem List Patient Active Problem List   Diagnosis Date Noted  . OSA (obstructive sleep apnea) 05/29/2012    Scot Jun 05/16/2016, 12:32  PM  [address] [place] [place], [place], [address] Phone: [phone]   Fax:  [phone]  Name: Brian Suarez MRN: [id number] Date of Birth: 11-May-1946

## 2016-05-22 ENCOUNTER — Ambulatory Visit: Payer: Medicare Other | Admitting: Physical Therapy

## 2016-05-22 ENCOUNTER — Encounter: Payer: Self-pay | Admitting: Physical Therapy

## 2016-05-22 DIAGNOSIS — M6281 Muscle weakness (generalized): Secondary | ICD-10-CM | POA: Diagnosis not present

## 2016-05-22 DIAGNOSIS — R262 Difficulty in walking, not elsewhere classified: Secondary | ICD-10-CM

## 2016-05-22 NOTE — Therapy (Signed)
[address] [place], [place], [address] Phone: [phone]   Fax:  [phone]  Physical Therapy Treatment  Patient Details  Name: Brian Suarez MRN: [id number] Date of Birth: 09/07/1945 Referring Provider: Jani Gravel  Encounter Date: 05/22/2016      PT End of Session - 05/22/16 1216    Visit Number 4   Date for PT Re-Evaluation 07/11/16   PT Start Time 1135   PT Stop Time 1219   PT Time Calculation (min) 44 min   Activity Tolerance Patient tolerated treatment well   Behavior During Therapy [facility] for tasks assessed/performed      Past Medical History:  Diagnosis Date  . Chronic renal insufficiency   . Gout   . Heart transplanted ([place])   . Hypercholesteremia   . Hypertension   . OSA (obstructive sleep apnea)     Past Surgical History:  Procedure Laterality Date  . CARDIAC SURGERY  2005   heart cath  . CYSTECTOMY     EPIDERMOID  . ENDOMYOCARDIAL BIOPSY  2005  . HEART TRANSPLANT  2002  . HERNIA REPAIR    . HERNIA REPAIR    . PERICARDIOCENTESIS  2010  . PERICARDIUM SURGERY  2002   BX     There were no vitals filed for this visit.      Subjective Assessment - 05/22/16 1137    Subjective "My R thigh is a little sore"   Currently in Pain? No/denies   Pain Score 0-No pain                         [facility] Adult PT Treatment/Exercise - 05/22/16 0001      High Level Balance   High Level Balance Activities Side stepping;Backward walking   High Level Balance Comments Standing march 2x10;     Exercises   Exercises Lumbar     Lumbar Exercises: Machines for Strengthening   Cybex Knee Extension 10# 3x10   Cybex Knee Flexion 25# 3x10   Cybex Leg Press 30# 3 sets 10     Lumbar Exercises: Seated   Other Seated Lumbar Exercises Seated overhead throws with blue ball 2x10     Knee/Hip Exercises: Aerobic   Nustep L 5 6 min   Other Aerobic UBE L 2 2 fwd/2 back     Knee/Hip  Exercises: Machines for Strengthening   Other Machine lat pull and seated row 25lb 3x10                   PT Short Term Goals - 05/11/16 1051      PT SHORT TERM GOAL #1   Title independent with safety and falls management   Time 2   Period Weeks   Status New           PT Long Term Goals - 05/16/16 1231      PT LONG TERM GOAL #2   Title walk 150 feet without device   Status On-going     PT LONG TERM GOAL #3   Title increase Berg balance test to 46/56   Status On-going     PT LONG TERM GOAL #4   Title increase hip strength to 4/5   Status On-going     PT LONG TERM GOAL #5   Title tolerate 45 minutes of activity without rest   Status On-going  Plan - 05/22/16 1217    Clinical Impression Statement Pt continues to demonstrate good strength and ROM with machine level interventions. Pt able to tolerate a progression to balance interventions but fatigues easily. Pt with a step to pattern to perform backwards walking. Increase instability with standing march requiring min assist to complete.    Rehab Potential Good   PT Frequency 2x / week   PT Duration 8 weeks   PT Treatment/Interventions ADLs/Self Care Home Management;Gait training;Functional mobility training;Therapeutic activities;Therapeutic exercise;Neuromuscular re-education;Patient/family education   PT Next Visit Plan Interventions to promote balance and functional endurance.      Patient will benefit from skilled therapeutic intervention in order to improve the following deficits and impairments:  Cardiopulmonary status limiting activity, Decreased activity tolerance, Decreased balance, Decreased mobility, Decreased endurance, Decreased strength, Difficulty walking  Visit Diagnosis: Difficulty in walking, not elsewhere classified  Muscle weakness (generalized)     Problem List Patient Active Problem List   Diagnosis Date Noted  . OSA (obstructive sleep apnea) 05/29/2012     Scot Jun 05/22/2016, 12:21 PM  [address] [place], [place], [address] Phone: [phone]   Fax:  [phone]  Name: Brian Suarez MRN: [id number] Date of Birth: [birth year]

## 2016-05-24 ENCOUNTER — Ambulatory Visit: Payer: Medicare Other | Admitting: Physical Therapy

## 2016-05-24 ENCOUNTER — Encounter: Payer: Self-pay | Admitting: Physical Therapy

## 2016-05-24 DIAGNOSIS — M6281 Muscle weakness (generalized): Secondary | ICD-10-CM

## 2016-05-24 DIAGNOSIS — R262 Difficulty in walking, not elsewhere classified: Secondary | ICD-10-CM

## 2016-05-24 NOTE — Therapy (Signed)
[address] [place], [place], [address] Phone: [phone]   Fax:  [phone]  Physical Therapy Treatment  Patient Details  Name: DOMINIK YORDY MRN: [id number] Date of Birth: [birth year] Referring Provider: Jani Gravel  Encounter Date: 05/24/2016      PT End of Session - 05/24/16 1222    Visit Number 5   Date for PT Re-Evaluation 07/11/16   PT Start Time 1142   PT Stop Time 1228   PT Time Calculation (min) 46 min   Activity Tolerance Patient tolerated treatment well   Behavior During Therapy [facility] for tasks assessed/performed      Past Medical History:  Diagnosis Date  . Chronic renal insufficiency   . Gout   . Heart transplanted ([place])   . Hypercholesteremia   . Hypertension   . OSA (obstructive sleep apnea)     Past Surgical History:  Procedure Laterality Date  . CARDIAC SURGERY  2005   heart cath  . CYSTECTOMY     EPIDERMOID  . ENDOMYOCARDIAL BIOPSY  2005  . HEART TRANSPLANT  2002  . HERNIA REPAIR    . HERNIA REPAIR    . PERICARDIOCENTESIS  2010  . PERICARDIUM SURGERY  2002   BX     There were no vitals filed for this visit.      Subjective Assessment - 05/24/16 1144    Subjective "My R thigh is  sore"   Pain Score 0-No pain                         [facility] Adult PT Treatment/Exercise - 05/24/16 0001      High Level Balance   High Level Balance Activities Side stepping;Backward walking;Marching forwards;Negotiating over obstacles   High Level Balance Comments Standing march 2x10;     Knee/Hip Exercises: Aerobic   Nustep L 5 6 min     Knee/Hip Exercises: Machines for Strengthening   Cybex Knee Extension 10# 3x10   Cybex Knee Flexion 35# 3x10   Cybex Leg Press 30# 3 sets 10   Other Machine lat pull and seated row 25lb 3x10                   PT Short Term Goals - 05/24/16 1216      PT SHORT TERM GOAL #1   Title independent with safety and falls  management   Status Achieved           PT Long Term Goals - 05/24/16 1216      PT LONG TERM GOAL #1   Title independent with HEP   Status Achieved     PT LONG TERM GOAL #2   Title walk 150 feet without device   Status On-going     PT LONG TERM GOAL #3   Title increase Berg balance test to 46/56   Status On-going     PT LONG TERM GOAL #4   Title increase hip strength to 4/5   Status On-going     PT LONG TERM GOAL #5   Title tolerate 45 minutes of activity without rest   Status On-going               Plan - 05/24/16 1222    Clinical Impression Statement Performed balance interventions before strengthening exercises this date. Pt with a better tolerance to balance interventions regarding fatigue. Pt reports some fatigue but not as much as last  treatment session when he only could perform a few balance exercises. Tolerated machine level exercises well,  tolerating additional weight with knee flexion   Rehab Potential Good   PT Frequency 2x / week   PT Duration 8 weeks   PT Treatment/Interventions ADLs/Self Care Home Management;Gait training;Functional mobility training;Therapeutic activities;Therapeutic exercise;Neuromuscular re-education;Patient/family education   PT Next Visit Plan Interventions to promote balance and functional endurance.      Patient will benefit from skilled therapeutic intervention in order to improve the following deficits and impairments:  Cardiopulmonary status limiting activity, Decreased activity tolerance, Decreased balance, Decreased mobility, Decreased endurance, Decreased strength, Difficulty walking  Visit Diagnosis: Muscle weakness (generalized)  Difficulty in walking, not elsewhere classified     Problem List Patient Active Problem List   Diagnosis Date Noted  . OSA (obstructive sleep apnea) 05/29/2012    Scot Jun 05/24/2016, 12:25 PM  [address] [place], [place], [address] Phone: [phone]   Fax:  [phone]  Name: SHALIK SANFILIPPO MRN: [id number] Date of Birth: 1946-03-08

## 2016-05-29 ENCOUNTER — Encounter: Payer: Self-pay | Admitting: Physical Therapy

## 2016-05-29 ENCOUNTER — Ambulatory Visit: Payer: Medicare Other | Attending: Internal Medicine | Admitting: Physical Therapy

## 2016-05-29 DIAGNOSIS — M6281 Muscle weakness (generalized): Secondary | ICD-10-CM | POA: Diagnosis present

## 2016-05-29 DIAGNOSIS — R262 Difficulty in walking, not elsewhere classified: Secondary | ICD-10-CM | POA: Insufficient documentation

## 2016-05-29 NOTE — Therapy (Signed)
[address] [place], [place], [address] Phone: [phone]   Fax:  [phone]  Physical Therapy Treatment  Patient Details  Name: Brian Suarez MRN: [id number] Date of Birth: 1945/10/16 Referring Provider: Jani Gravel  Encounter Date: 05/29/2016      PT End of Session - 05/29/16 1115    Visit Number 6   Date for PT Re-Evaluation 07/11/16   PT Start Time 1050   PT Stop Time 1135   PT Time Calculation (min) 45 min      Past Medical History:  Diagnosis Date  . Chronic renal insufficiency   . Gout   . Heart transplanted ([place])   . Hypercholesteremia   . Hypertension   . OSA (obstructive sleep apnea)     Past Surgical History:  Procedure Laterality Date  . CARDIAC SURGERY  2005   heart cath  . CYSTECTOMY     EPIDERMOID  . ENDOMYOCARDIAL BIOPSY  2005  . HEART TRANSPLANT  2002  . HERNIA REPAIR    . HERNIA REPAIR    . PERICARDIOCENTESIS  2010  . PERICARDIUM SURGERY  2002   BX     There were no vitals filed for this visit.      Subjective Assessment - 05/29/16 1051    Subjective rt thigh still alittle sore, getting around alot better   Currently in Pain? Yes   Pain Score 2    Pain Location Leg   Pain Orientation Right                         [facility] Adult PT Treatment/Exercise - 05/29/16 0001      Ambulation/Gait   Ambulation/Gait Yes   Ambulation/Gait Assistance 5: Supervision   Ambulation Distance (Feet) 180 Feet   Assistive device --  none   Gait Pattern Decreased stance time - right;Decreased arm swing - right;Decreased arm swing - left  rt LE weakness noted   Gait Comments slow gait,weakness RT LE and fatigued after     High Level Balance   High Level Balance Activities Side stepping;Marching forwards;Marching backwards  3# HHA 20 feet 2 times each     Knee/Hip Exercises: Aerobic   Nustep L 5 6 min   Other Aerobic UBE L 4 2 fwd/2 back     Knee/Hip  Exercises: Machines for Strengthening   Cybex Knee Extension 10# 3x10   Cybex Knee Flexion 35# 3x10   Cybex Leg Press 30# 3 sets 10   Other Machine lat pull and seated row 25lb 2 sets 15                  PT Short Term Goals - 05/24/16 1216      PT SHORT TERM GOAL #1   Title independent with safety and falls management   Status Achieved           PT Long Term Goals - 05/29/16 1108      PT LONG TERM GOAL #2   Title walk 150 feet without device   Status On-going     PT LONG TERM GOAL #4   Title increase hip strength to 4/5   Status On-going     PT LONG TERM GOAL #5   Title tolerate 45 minutes of activity without rest   Status On-going               Plan - 05/29/16 1115  Clinical Impression Statement increased gait distance without AD ( goal met), no LOB but slow gait and weakness noted in RT LE. Endurance improving but does continue to fatigue with ther ex   PT Next Visit Plan Interventions to promote balance and functional endurance.      Patient will benefit from skilled therapeutic intervention in order to improve the following deficits and impairments:  Cardiopulmonary status limiting activity, Decreased activity tolerance, Decreased balance, Decreased mobility, Decreased endurance, Decreased strength, Difficulty walking  Visit Diagnosis: Muscle weakness (generalized)  Difficulty in walking, not elsewhere classified     Problem List Patient Active Problem List   Diagnosis Date Noted  . OSA (obstructive sleep apnea) 05/29/2012    Leandre Wien,ANGIE PTA 05/29/2016, 11:18 AM  [address] [place], [place], [address] Phone: [phone]   Fax:  [phone]  Name: Brian Suarez MRN: [id number] Date of Birth: 06/05/1946

## 2016-05-31 ENCOUNTER — Ambulatory Visit: Payer: Medicare Other | Admitting: Physical Therapy

## 2016-05-31 ENCOUNTER — Encounter: Payer: Self-pay | Admitting: Physical Therapy

## 2016-05-31 DIAGNOSIS — R262 Difficulty in walking, not elsewhere classified: Secondary | ICD-10-CM

## 2016-05-31 DIAGNOSIS — M6281 Muscle weakness (generalized): Secondary | ICD-10-CM | POA: Diagnosis not present

## 2016-05-31 NOTE — Therapy (Signed)
[address] [place], [place], [address] Phone: [phone]   Fax:  [phone]  Physical Therapy Treatment  Patient Details  Name: Brian Suarez MRN: [id number] Date of Birth: 1946/07/24 Referring Provider: Jani Gravel  Encounter Date: 05/31/2016      PT End of Session - 05/31/16 1143    Visit Number 7   Date for PT Re-Evaluation 07/11/16   PT Start Time 1100   PT Stop Time 1143   PT Time Calculation (min) 43 min      Past Medical History:  Diagnosis Date  . Chronic renal insufficiency   . Gout   . Heart transplanted ([place])   . Hypercholesteremia   . Hypertension   . OSA (obstructive sleep apnea)     Past Surgical History:  Procedure Laterality Date  . CARDIAC SURGERY  2005   heart cath  . CYSTECTOMY     EPIDERMOID  . ENDOMYOCARDIAL BIOPSY  2005  . HEART TRANSPLANT  2002  . HERNIA REPAIR    . HERNIA REPAIR    . PERICARDIOCENTESIS  2010  . PERICARDIUM SURGERY  2002   BX     There were no vitals filed for this visit.      Subjective Assessment - 05/31/16 1104    Subjective "My R thigh is bothering me, I did some stuff yesterday" "I cut the grass". Pt reports using a ridding mower    Currently in Pain? Yes   Pain Score 2    Pain Location Leg                         [facility] Adult PT Treatment/Exercise - 05/31/16 0001      High Level Balance   High Level Balance Activities Negotiating around obstacles;Negotiating over obstacles;Marching forwards   High Level Balance Comments Lateral step ups over obstacles; [employer]one pick up; Toe and heel walking     Knee/Hip Exercises: Aerobic   Nustep L 5 6 min     Knee/Hip Exercises: Machines for Strengthening   Cybex Knee Extension 10# 3x10   Cybex Knee Flexion 35# 3x10; RLE only 5lb 2x5   Cybex Leg Press 40# 3 sets 10   Other Machine lat pull and seated row 35lb 3 sets 10                  PT Short Term Goals -  05/24/16 1216      PT SHORT TERM GOAL #1   Title independent with safety and falls management   Status Achieved           PT Long Term Goals - 05/31/16 1148      PT LONG TERM GOAL #2   Title walk 150 feet without device   Status On-going     PT LONG TERM GOAL #3   Title increase Berg balance test to 46/56   Status On-going     PT LONG TERM GOAL #4   Title increase hip strength to 4/5   Status On-going               Plan - 05/31/16 1144    Clinical Impression Statement Pt has some difficulty with balance interventions more so with negotiating over objects. Pt able to tolerate additional weight on machine level interventions. Overall pt endurance is improving but balance interventions are taxing.    Rehab Potential Good   PT Frequency 2x / week  PT Duration 8 weeks   PT Treatment/Interventions ADLs/Self Care Home Management;Gait training;Functional mobility training;Therapeutic activities;Therapeutic exercise;Neuromuscular re-education;Patient/family education   PT Next Visit Plan Interventions to promote balance and functional endurance.      Patient will benefit from skilled therapeutic intervention in order to improve the following deficits and impairments:  Cardiopulmonary status limiting activity, Decreased activity tolerance, Decreased balance, Decreased mobility, Decreased endurance, Decreased strength, Difficulty walking  Visit Diagnosis: Muscle weakness (generalized)  Difficulty in walking, not elsewhere classified     Problem List Patient Active Problem List   Diagnosis Date Noted  . OSA (obstructive sleep apnea) 05/29/2012    Scot Jun, PTA 05/31/2016, 11:48 AM  [place] [address] [place], [place], [address] Phone: [phone]   Fax:  [phone]  Name: Brian Suarez MRN: [id number] Date of Birth: [birth year]

## 2016-06-05 ENCOUNTER — Ambulatory Visit: Payer: Medicare Other | Admitting: Physical Therapy

## 2016-06-05 ENCOUNTER — Encounter: Payer: Self-pay | Admitting: Physical Therapy

## 2016-06-05 DIAGNOSIS — R262 Difficulty in walking, not elsewhere classified: Secondary | ICD-10-CM

## 2016-06-05 DIAGNOSIS — M6281 Muscle weakness (generalized): Secondary | ICD-10-CM

## 2016-06-05 NOTE — Therapy (Signed)
[address] [place], [place], [address] Phone: [phone]   Fax:  [phone]  Physical Therapy Treatment  Patient Details  Name: Brian STROTHMAN MRN: [id number] Date of Birth: 03/06/1946 Referring Provider: Jani Gravel  Encounter Date: 06/05/2016      PT End of Session - 06/05/16 1137    Visit Number 8   Date for PT Re-Evaluation 07/11/16   PT Start Time 1101   PT Stop Time 1143   PT Time Calculation (min) 42 min   Activity Tolerance Patient tolerated treatment well   Behavior During Therapy [facility] for tasks assessed/performed      Past Medical History:  Diagnosis Date  . Chronic renal insufficiency   . Gout   . Heart transplanted ([place])   . Hypercholesteremia   . Hypertension   . OSA (obstructive sleep apnea)     Past Surgical History:  Procedure Laterality Date  . CARDIAC SURGERY  2005   heart cath  . CYSTECTOMY     EPIDERMOID  . ENDOMYOCARDIAL BIOPSY  2005  . HEART TRANSPLANT  2002  . HERNIA REPAIR    . HERNIA REPAIR    . PERICARDIOCENTESIS  2010  . PERICARDIUM SURGERY  2002   BX     There were no vitals filed for this visit.      Subjective Assessment - 06/05/16 1100    Subjective Pt reports increase pain in R groin area. Pt stated that his increase pain started Friday when he was cutting grass, this has been bothering him all weekend   Currently in Pain? Yes   Pain Score 4    Pain Location Groin   Pain Orientation Right            [facility] PT Assessment - 06/05/16 0001      Berg Balance Test   Sit to Stand Able to stand without using hands and stabilize independently   Standing Unsupported Able to stand safely 2 minutes   Sitting with Back Unsupported but Feet Supported on Floor or Stool Able to sit safely and securely 2 minutes   Stand to Sit Sits safely with minimal use of hands   Transfers Able to transfer safely, definite need of hands   Standing Unsupported with Eyes  Closed Able to stand 10 seconds safely   Standing Ubsupported with Feet Together Able to place feet together independently and stand 1 minute safely   From Standing, Reach Forward with Outstretched Arm Can reach forward >12 cm safely (5")   From Standing Position, Pick up Object from Floor Able to pick up shoe safely and easily   From Standing Position, Turn to Look Behind Over each Shoulder Looks behind one side only/other side shows less weight shift   Turn 360 Degrees Able to turn 360 degrees safely one side only in 4 seconds or less   Standing Unsupported, Alternately Place Feet on Step/Stool Able to stand independently and safely and complete 8 steps in 20 seconds   Standing Unsupported, One Foot in Front Able to place foot tandem independently and hold 30 seconds  Some instability with RLE in front    Standing on One Leg Tries to lift leg/unable to hold 3 seconds but remains standing independently   Total Score 49                     [facility] Adult PT Treatment/Exercise - 06/05/16 0001      Knee/Hip  Exercises: Aerobic   Nustep L 5 6 min   Other Aerobic UBE L 4 3 fwd/3 back     Knee/Hip Exercises: Machines for Strengthening   Cybex Leg Press 40# 3 sets 10   Other Machine lat pull and seated row 35lb 3 sets 10                  PT Short Term Goals - 05/24/16 1216      PT SHORT TERM GOAL #1   Title independent with safety and falls management   Status Achieved           PT Long Term Goals - 06/05/16 1129      PT LONG TERM GOAL #2   Title walk 150 feet without device   Status On-going     PT LONG TERM GOAL #3   Title increase Berg balance test to 46/56   Status Achieved  49/56     PT LONG TERM GOAL #4   Title increase hip strength to 4/5   Status On-going               Plan - 06/05/16 1137    Clinical Impression Statement Pt has progressed towards all goals. Pt able to increase his BERG score 49/100. No issues with machine level  interventions. BERG balance test does fatigue pt during treatment. Pt is tall and does require an elevated surface to perform sit to stand without use of UE.   Rehab Potential Good   PT Frequency 2x / week   PT Duration 8 weeks   PT Treatment/Interventions ADLs/Self Care Home Management;Gait training;Functional mobility training;Therapeutic activities;Therapeutic exercise;Neuromuscular re-education;Patient/family education   PT Next Visit Plan Interventions to promote balance and functional endurance.      Patient will benefit from skilled therapeutic intervention in order to improve the following deficits and impairments:  Cardiopulmonary status limiting activity, Decreased activity tolerance, Decreased balance, Decreased mobility, Decreased endurance, Decreased strength, Difficulty walking  Visit Diagnosis: Muscle weakness (generalized)  Difficulty in walking, not elsewhere classified     Problem List Patient Active Problem List   Diagnosis Date Noted  . OSA (obstructive sleep apnea) 05/29/2012    Scot Jun, PTA  06/05/2016, 11:41 AM  [place] [address] [place], [place], [address] Phone: [phone]   Fax:  [phone]  Name: Brian Suarez MRN: [id number] Date of Birth: 1946-03-20

## 2016-06-07 ENCOUNTER — Ambulatory Visit: Payer: Medicare Other | Admitting: Physical Therapy

## 2016-06-12 ENCOUNTER — Encounter: Payer: Self-pay | Admitting: Physical Therapy

## 2016-06-12 ENCOUNTER — Ambulatory Visit: Payer: Medicare Other | Admitting: Physical Therapy

## 2016-06-12 DIAGNOSIS — M6281 Muscle weakness (generalized): Secondary | ICD-10-CM

## 2016-06-12 DIAGNOSIS — R262 Difficulty in walking, not elsewhere classified: Secondary | ICD-10-CM

## 2016-06-12 NOTE — Therapy (Signed)
[place], [place], [address] Phone: [phone]   Fax:  [phone]  Physical Therapy Treatment  Patient Details  Name: Brian Suarez MRN: [id number] Date of Birth: Jun 02, 1946 Referring Provider: Jani Gravel  Encounter Date: 06/12/2016      PT End of Session - 06/12/16 1142    Visit Number 9   Date for PT Re-Evaluation 07/11/16   PT Start Time 1100   PT Stop Time 1144   PT Time Calculation (min) 44 min   Activity Tolerance Patient tolerated treatment well   Behavior During Therapy [facility] for tasks assessed/performed      Past Medical History:  Diagnosis Date  . Chronic renal insufficiency   . Gout   . Heart transplanted ([place])   . Hypercholesteremia   . Hypertension   . OSA (obstructive sleep apnea)     Past Surgical History:  Procedure Laterality Date  . CARDIAC SURGERY  2005   heart cath  . CYSTECTOMY     EPIDERMOID  . ENDOMYOCARDIAL BIOPSY  2005  . HEART TRANSPLANT  2002  . HERNIA REPAIR    . HERNIA REPAIR    . PERICARDIOCENTESIS  2010  . PERICARDIUM SURGERY  2002   BX     There were no vitals filed for this visit.      Subjective Assessment - 06/12/16 1101    Subjective "Im ok, my leg is feeling a little bit better"   Currently in Pain? Yes   Pain Score 2    Pain Location --  R thigh                         [facility] Adult PT Treatment/Exercise - 06/12/16 0001      Knee/Hip Exercises: Aerobic   Stationary Bike L 2 x 6 min    Other Aerobic UBE L 4 3 fwd/3 back     Knee/Hip Exercises: Machines for Strengthening   Cybex Knee Flexion 35# 3x10   Cybex Leg Press 40# 3 sets 10   Other Machine lat pull 25lb and seated row 35lb 3 sets 10     Knee/Hip Exercises: Standing   Other Standing Knee Exercises standing march 3lb 2x10   Other Standing Knee Exercises Side stepping 3in cuff  weights      Knee/Hip Exercises: Seated   Sit to Sand 2 sets;10  reps;with UE support  seated on airex pad; second set holding yellow ball.                   PT Short Term Goals - 05/24/16 1216      PT SHORT TERM GOAL #1   Title independent with safety and falls management   Status Achieved           PT Long Term Goals - 06/05/16 1129      PT LONG TERM GOAL #2   Title walk 150 feet without device   Status On-going     PT LONG TERM GOAL #3   Title increase Berg balance test to 46/56   Status Achieved  49/56     PT LONG TERM GOAL #4   Title increase hip strength to 4/5   Status On-going               Plan - 06/12/16 1142    Clinical Impression Statement Pt able to complete all of todays exercises but does fatigue quickly.  Pt reports that his R thigh has gotten better.    Rehab Potential Good   PT Frequency 2x / week   PT Duration 8 weeks   PT Treatment/Interventions ADLs/Self Care Home Management;Gait training;Functional mobility training;Therapeutic activities;Therapeutic exercise;Neuromuscular re-education;Patient/family education   PT Next Visit Plan Interventions to promote balance and functional endurance.      Patient will benefit from skilled therapeutic intervention in order to improve the following deficits and impairments:  Cardiopulmonary status limiting activity, Decreased activity tolerance, Decreased balance, Decreased mobility, Decreased endurance, Decreased strength, Difficulty walking  Visit Diagnosis: Muscle weakness (generalized)  Difficulty in walking, not elsewhere classified     Problem List Patient Active Problem List   Diagnosis Date Noted  . OSA (obstructive sleep apnea) 05/29/2012    Scot Jun, PTA  06/12/2016, 11:45 AM  [place] [address] [place], [place], [address] Phone: [phone]   Fax:  [phone]  Name: HOSEY BURMESTER MRN: [id number] Date of Birth: 14-Feb-1946

## 2016-06-14 ENCOUNTER — Encounter: Payer: Self-pay | Admitting: Physical Therapy

## 2016-06-14 ENCOUNTER — Ambulatory Visit: Payer: Medicare Other | Admitting: Physical Therapy

## 2016-06-14 DIAGNOSIS — M6281 Muscle weakness (generalized): Secondary | ICD-10-CM | POA: Diagnosis not present

## 2016-06-14 DIAGNOSIS — R262 Difficulty in walking, not elsewhere classified: Secondary | ICD-10-CM

## 2016-06-14 NOTE — Therapy (Signed)
[place], [place], [address] Phone: [phone]   Fax:  [phone]  Physical Therapy Treatment  Patient Details  Name: Brian Suarez MRN: [id number] Date of Birth: 10-Aug-1946 Referring Provider: Jani Gravel  Encounter Date: 06/14/2016      PT End of Session - 06/14/16 1144    Visit Number 10   Date for PT Re-Evaluation 07/11/16   PT Start Time 1100   PT Stop Time 1144   PT Time Calculation (min) 44 min   Activity Tolerance Patient tolerated treatment well   Behavior During Therapy [facility] for tasks assessed/performed      Past Medical History:  Diagnosis Date  . Chronic renal insufficiency   . Gout   . Heart transplanted ([place])   . Hypercholesteremia   . Hypertension   . OSA (obstructive sleep apnea)     Past Surgical History:  Procedure Laterality Date  . CARDIAC SURGERY  2005   heart cath  . CYSTECTOMY     EPIDERMOID  . ENDOMYOCARDIAL BIOPSY  2005  . HEART TRANSPLANT  2002  . HERNIA REPAIR    . HERNIA REPAIR    . PERICARDIOCENTESIS  2010  . PERICARDIUM SURGERY  2002   BX     There were no vitals filed for this visit.      Subjective Assessment - 06/14/16 1058    Subjective "Ok, I was tired after last time"   Currently in Pain? Yes   Pain Score 1    Pain Location --  R thigh                         [facility] Adult PT Treatment/Exercise - 06/14/16 0001      Ambulation/Gait   Ambulation/Gait Yes   Ambulation/Gait Assistance 5: Supervision  SBA   Ambulation Distance (Feet) 300 Feet   Assistive device Straight cane;None   Gait Pattern Decreased arm swing - right;Step-through pattern;Lateral trunk lean to right   Ambulation Surface Level;Unlevel;Indoor;Paved;Outdoor   Gait Comments Gait distant broken up into 2 trials, first 250 performed with SPC outside up ans down slopes over uneven terrain. second trial without ad level  terrain indoors, pt with a  lateral sway to R with ambulation with little instability.     High Level Balance   High Level Balance Activities Side stepping     Knee/Hip Exercises: Aerobic   Other Aerobic UBE L 4 3 fwd/3 back     Knee/Hip Exercises: Machines for Strengthening   Cybex Knee Extension 5lb 2x10; RLE only 5lb 2x10   Cybex Knee Flexion 35# 2x10; RLE only 20lb 2x10   Cybex Leg Press 40# 3 sets 10   Other Machine lat pull 35lb and seated row 35lb 3 sets 10                  PT Short Term Goals - 05/24/16 1216      PT SHORT TERM GOAL #1   Title independent with safety and falls management   Status Achieved           PT Long Term Goals - 06/14/16 1110      PT LONG TERM GOAL #1   Title independent with HEP   Status Achieved     PT LONG TERM GOAL #2   Title walk 150 feet without device   Status Partially Met     PT LONG TERM GOAL #3  Title increase Berg balance test to 46/56   Status Achieved     PT LONG TERM GOAL #5   Title tolerate 45 minutes of activity without rest   Status Partially Met               Plan - 06/14/16 1152    Clinical Impression Statement Instructed pt on proper use of SPC. Pt able to ambulate outside over uneven terrain with SPC but fatigues quickly. Some difficulty controlling gait speed when walking down hill. Attempted gait trial without AD, pt demo ed some instability. Completed all machine level interventions well. Pt able to tolerate some isolated strengthening with RLE only.   Rehab Potential Good   PT Frequency 2x / week   PT Duration 8 weeks   PT Treatment/Interventions ADLs/Self Care Home Management;Gait training;Functional mobility training;Therapeutic activities;Therapeutic exercise;Neuromuscular re-education;Patient/family education   PT Next Visit Plan Interventions to promote balance and functional endurance.      Patient will benefit from skilled therapeutic intervention in order to improve the following deficits and impairments:   Cardiopulmonary status limiting activity, Decreased activity tolerance, Decreased balance, Decreased mobility, Decreased endurance, Decreased strength, Difficulty walking  Visit Diagnosis: Muscle weakness (generalized)  Difficulty in walking, not elsewhere classified     Problem List Patient Active Problem List   Diagnosis Date Noted  . OSA (obstructive sleep apnea) 05/29/2012    Scot Jun, PTA  06/14/2016, 11:55 AM  [place], [place], [address] Phone: [phone]   Fax:  [phone]  Name: Brian Suarez MRN: [id number] Date of Birth: 1945-12-30

## 2016-06-19 ENCOUNTER — Ambulatory Visit: Payer: Medicare Other | Admitting: Physical Therapy

## 2016-06-19 ENCOUNTER — Encounter: Payer: Self-pay | Admitting: Physical Therapy

## 2016-06-19 DIAGNOSIS — M6281 Muscle weakness (generalized): Secondary | ICD-10-CM | POA: Diagnosis not present

## 2016-06-19 DIAGNOSIS — R262 Difficulty in walking, not elsewhere classified: Secondary | ICD-10-CM

## 2016-06-19 NOTE — Therapy (Signed)
[place] [address] [place], [place], [address] Phone: [phone]   Fax:  [phone]  Physical Therapy Treatment  Patient Details  Name: Brian Suarez MRN: [id number] Date of Birth: 1946/02/28 Referring Provider: Jani Gravel  Encounter Date: 06/19/2016      PT End of Session - 06/19/16 1147    Visit Number 11   Date for PT Re-Evaluation 07/11/16   PT Start Time 1101   PT Stop Time 1156   PT Time Calculation (min) 55 min   Activity Tolerance Patient tolerated treatment well   Behavior During Therapy [facility] for tasks assessed/performed      Past Medical History:  Diagnosis Date  . Chronic renal insufficiency   . Gout   . Heart transplanted ([place])   . Hypercholesteremia   . Hypertension   . OSA (obstructive sleep apnea)     Past Surgical History:  Procedure Laterality Date  . CARDIAC SURGERY  2005   heart cath  . CYSTECTOMY     EPIDERMOID  . ENDOMYOCARDIAL BIOPSY  2005  . HEART TRANSPLANT  2002  . HERNIA REPAIR    . HERNIA REPAIR    . PERICARDIOCENTESIS  2010  . PERICARDIUM SURGERY  2002   BX     There were no vitals filed for this visit.      Subjective Assessment - 06/19/16 1105    Subjective "Im ok, the leg is still bothering me"   Currently in Pain? Yes   Pain Score 2    Pain Location --  thigh   Pain Orientation Right                         [facility] Adult PT Treatment/Exercise - 06/19/16 0001      Ambulation/Gait   Ambulation/Gait Yes   Ambulation/Gait Assistance 5: Supervision  SBA   Ambulation Distance (Feet) 600 Feet   Assistive device Straight cane   Gait Pattern Decreased arm swing - right;Step-through pattern;Lateral trunk lean to right   Ambulation Surface Unlevel;Paved;Outdoor   Gait Comments Gait distance all in one trial, pt very fatigue after requiring a lengthy rest break. Pt able to ambulate over uneven terrain up and down slops. Decrease foot  clearance ad pt fatigues/     Knee/Hip Exercises: Aerobic   Nustep L 5 6 min   Other Aerobic UBE L 4 3 fwd/3 back     Knee/Hip Exercises: Machines for Strengthening   Other Machine lat pull 35lb and seated row 35lb 3 sets 10     Knee/Hip Exercises: Seated   Abduction/Adduction  Both;2 sets;20 reps     Modalities   Modalities Moist Heat     Moist Heat Therapy   Number Minutes Moist Heat 10 Minutes   Moist Heat Location Hip                  PT Short Term Goals - 05/24/16 1216      PT SHORT TERM GOAL #1   Title independent with safety and falls management   Status Achieved           PT Long Term Goals - 06/14/16 1110      PT LONG TERM GOAL #1   Title independent with HEP   Status Achieved     PT LONG TERM GOAL #2   Title walk 150 feet without device   Status Partially Met     PT LONG  TERM GOAL #3   Title increase Berg balance test to 46/56   Status Achieved     PT LONG TERM GOAL #5   Title tolerate 45 minutes of activity without rest   Status Partially Met               Plan - 06/19/16 1147    Clinical Impression Statement Pt very fatigue with outdoor gait trial. Min to mod cues required for pt to maintain appropriate gait pattern with SPC. Pt with some decrease foot clarence when pt fatigues. Pt with better control of gait speed going up and down slopes.. Pt does reports some inner thigh soreness of RLE.   Rehab Potential Good   PT Frequency 2x / week   PT Duration 8 weeks   PT Treatment/Interventions ADLs/Self Care Home Management;Gait training;Functional mobility training;Therapeutic activities;Therapeutic exercise;Neuromuscular re-education;Patient/family education   PT Next Visit Plan Interventions to promote balance and functional endurance.      Patient will benefit from skilled therapeutic intervention in order to improve the following deficits and impairments:  Cardiopulmonary status limiting activity, Decreased activity tolerance,  Decreased balance, Decreased mobility, Decreased endurance, Decreased strength, Difficulty walking  Visit Diagnosis: Muscle weakness (generalized)  Difficulty in walking, not elsewhere classified     Problem List Patient Active Problem List   Diagnosis Date Noted  . OSA (obstructive sleep apnea) 05/29/2012    Scot Jun, PTA  06/19/2016, 11:50 AM  [place], [place], [address] Phone: [phone]   Fax:  [phone]  Name: MARSHALL KAMPF MRN: [id number] Date of Birth: 09-17-1945

## 2016-06-21 ENCOUNTER — Ambulatory Visit: Payer: Medicare Other | Admitting: Physical Therapy

## 2016-06-21 ENCOUNTER — Encounter: Payer: Self-pay | Admitting: Physical Therapy

## 2016-06-21 DIAGNOSIS — M6281 Muscle weakness (generalized): Secondary | ICD-10-CM

## 2016-06-21 DIAGNOSIS — R262 Difficulty in walking, not elsewhere classified: Secondary | ICD-10-CM

## 2016-06-21 NOTE — Therapy (Signed)
[place] [address] [place], [place], [address] Phone: [phone]   Fax:  [phone]  Physical Therapy Treatment  Patient Details  Name: Brian Suarez MRN: [id number] Date of Birth: 07-29-1946 Referring Provider: Jani Gravel  Encounter Date: 06/21/2016      PT End of Session - 06/21/16 1130    Visit Number 12   Date for PT Re-Evaluation 07/11/16   PT Start Time 0347   PT Stop Time 1134   PT Time Calculation (min) 43 min   Activity Tolerance Patient tolerated treatment well      Past Medical History:  Diagnosis Date  . Chronic renal insufficiency   . Gout   . Heart transplanted ([place])   . Hypercholesteremia   . Hypertension   . OSA (obstructive sleep apnea)     Past Surgical History:  Procedure Laterality Date  . CARDIAC SURGERY  2005   heart cath  . CYSTECTOMY     EPIDERMOID  . ENDOMYOCARDIAL BIOPSY  2005  . HEART TRANSPLANT  2002  . HERNIA REPAIR    . HERNIA REPAIR    . PERICARDIOCENTESIS  2010  . PERICARDIUM SURGERY  2002   BX     There were no vitals filed for this visit.      Subjective Assessment - 06/21/16 1053    Subjective Pt enters clinic ambulating with a SPC. "Doing all right" Pt reports that he can do a lot more    Currently in Pain? Yes   Pain Score 1    Pain Location --  Thigh   Pain Orientation Right            [facility] PT Assessment - 06/21/16 0001      Strength   Overall Strength Comments UE strength 4/5, 4-/5 for the hips, 4/5 for the knees                     [facility] Adult PT Treatment/Exercise - 06/21/16 0001      Knee/Hip Exercises: Aerobic   Nustep L 5 6 min   Other Aerobic UBE L 4 3 fwd/3 back     Knee/Hip Exercises: Machines for Strengthening   Cybex Knee Extension 15lb 2x10; single lef 5lb x15    Cybex Knee Flexion 35# 2x10; single leg 20lb x15     Knee/Hip Exercises: Standing   Hip Abduction Both;2 sets;10 reps   Abduction  Limitations 3   Hip Extension 2 sets;Both;10 reps   Extension Limitations 3   Other Standing Knee Exercises standing march 3lb 2x10                  PT Short Term Goals - 05/24/16 1216      PT SHORT TERM GOAL #1   Title independent with safety and falls management   Status Achieved           PT Long Term Goals - 06/21/16 1132      PT LONG TERM GOAL #1   Title independent with HEP   Status Achieved     PT LONG TERM GOAL #2   Title walk 150 feet without device   Status Partially Met     PT LONG TERM GOAL #3   Title increase Berg balance test to 46/56   Status Achieved     PT LONG TERM GOAL #4   Title increase hip strength to 4/5   Status On-going     PT  LONG TERM GOAL #5   Title tolerate 45 minutes of activity without rest   Status Partially Met               Plan - 06/21/16 1130    Clinical Impression Statement Pt able to complete all of today's exercises but has some difficulty with hip isolation interventions.  Pt does requires SBA to perform standing hip extension and abduction. Pt is very weak in bilat hips. Pt LE's does fatigue with today's interventions requiring  frequent seated rest breakers   Rehab Potential Good   PT Frequency 2x / week   PT Duration 8 weeks   PT Treatment/Interventions ADLs/Self Care Home Management;Gait training;Functional mobility training;Therapeutic activities;Therapeutic exercise;Neuromuscular re-education;Patient/family education   PT Next Visit Plan balance and hip strengthening      Patient will benefit from skilled therapeutic intervention in order to improve the following deficits and impairments:  Cardiopulmonary status limiting activity, Decreased activity tolerance, Decreased balance, Decreased mobility, Decreased endurance, Decreased strength, Difficulty walking  Visit Diagnosis: Muscle weakness (generalized)  Difficulty in walking, not elsewhere classified     Problem List Patient Active Problem  List   Diagnosis Date Noted  . OSA (obstructive sleep apnea) 05/29/2012    Scot Jun 06/21/2016, 11:34 AM  [place] [address] [place], [place], [address] Phone: [phone]   Fax:  [phone]  Name: Brian Suarez MRN: [id number] Date of Birth: Mar 03, 1946

## 2016-06-26 ENCOUNTER — Encounter: Payer: Self-pay | Admitting: Physical Therapy

## 2016-06-26 ENCOUNTER — Ambulatory Visit: Payer: Medicare Other | Admitting: Physical Therapy

## 2016-06-26 DIAGNOSIS — M6281 Muscle weakness (generalized): Secondary | ICD-10-CM

## 2016-06-26 DIAGNOSIS — R262 Difficulty in walking, not elsewhere classified: Secondary | ICD-10-CM

## 2016-06-26 NOTE — Therapy (Signed)
[place], [place], [address] Phone: [phone]   Fax:  [phone]  Physical Therapy Treatment  Patient Details  Name: Brian Suarez MRN: [id number] Date of Birth: September 25, 1945 Referring Provider: Jani Gravel  Encounter Date: 06/26/2016      PT End of Session - 06/26/16 1140    Visit Number 13   Date for PT Re-Evaluation 07/11/16   PT Start Time 1058   PT Stop Time 1142   PT Time Calculation (min) 44 min   Activity Tolerance Patient tolerated treatment well   Behavior During Therapy [facility] for tasks assessed/performed      Past Medical History:  Diagnosis Date  . Chronic renal insufficiency   . Gout   . Heart transplanted ([place])   . Hypercholesteremia   . Hypertension   . OSA (obstructive sleep apnea)     Past Surgical History:  Procedure Laterality Date  . CARDIAC SURGERY  2005   heart cath  . CYSTECTOMY     EPIDERMOID  . ENDOMYOCARDIAL BIOPSY  2005  . HEART TRANSPLANT  2002  . HERNIA REPAIR    . HERNIA REPAIR    . PERICARDIOCENTESIS  2010  . PERICARDIUM SURGERY  2002   BX     There were no vitals filed for this visit.      Subjective Assessment - 06/26/16 1059    Subjective "Im doing fine"   Currently in Pain? Yes   Pain Score 1    Pain Location --  R thigh                         [facility] Adult PT Treatment/Exercise - 06/26/16 0001      Ambulation/Gait   Ambulation/Gait Yes   Ambulation/Gait Assistance 5: Supervision   Ambulation Distance (Feet) 600 Feet   Assistive device Straight cane   Gait Pattern Decreased arm swing - right;Step-through pattern;Lateral trunk lean to right   Ambulation Surface Unlevel;Outdoor;Paved   Stairs Yes   Stairs Assistance 6: Modified independent (Device/Increase time);5: Supervision   Stair Management Technique One rail Right;One rail Left;Alternating pattern;With cane   Number of Stairs 36   Height of Stairs 6   Gait Comments Gait distance all in one trial, pt very fatigue afterwards requiring a lengthily rest break. Pt able to ambulate over uneven terrain up and down sloes. Decrease foot clearance ad pt fatigues/     Lumbar Exercises: Aerobic   Elliptical NuStep L6 x6    UBE (Upper Arm Bike) L3 10fd/3rev     Lumbar Exercises: Seated   Sit to Stand 10 reps  UE support     Knee/Hip Exercises: Machines for Strengthening   Other Machine lat pull 35lb and seated row 35lb 3 sets 10                  PT Short Term Goals - 05/24/16 1216      PT SHORT TERM GOAL #1   Title independent with safety and falls management   Status Achieved           PT Long Term Goals - 06/26/16 1143      PT LONG TERM GOAL #1   Title independent with HEP   Status Achieved     PT LONG TERM GOAL #2   Title walk 150 feet without device   Status Partially Met     PT LONG TERM GOAL #3  Title increase Berg balance test to 46/56   Status Achieved     PT LONG TERM GOAL #4   Title increase hip strength to 4/5   Status On-going     PT LONG TERM GOAL #5   Title tolerate 45 minutes of activity without rest   Status Partially Met               Plan - 06/26/16 1140    Clinical Impression Statement Pt able to progress to stair negotiation with [facility] and able to perform some negotiation without AD. Pt does fatigue quick with functional interventions requiring lengthy rest periods.   Rehab Potential Good   PT Frequency 2x / week   PT Duration 8 weeks   PT Treatment/Interventions ADLs/Self Care Home Management;Gait training;Functional mobility training;Therapeutic activities;Therapeutic exercise;Neuromuscular re-education;Patient/family education   PT Next Visit Plan D/C next visit      Patient will benefit from skilled therapeutic intervention in order to improve the following deficits and impairments:  Cardiopulmonary status limiting activity, Decreased activity tolerance, Decreased balance,  Decreased mobility, Decreased endurance, Decreased strength, Difficulty walking  Visit Diagnosis: Difficulty in walking, not elsewhere classified  Muscle weakness (generalized)     Problem List Patient Active Problem List   Diagnosis Date Noted  . OSA (obstructive sleep apnea) 05/29/2012    Scot Jun, PTA 06/26/2016, 11:44 AM  [place] [address] [place], [place], [address] Phone: [phone]   Fax:  [phone]  Name: Brian Suarez MRN: [id number] Date of Birth: 16-Oct-1945

## 2016-06-28 ENCOUNTER — Ambulatory Visit: Payer: Medicare Other | Attending: Internal Medicine | Admitting: Physical Therapy

## 2016-06-28 ENCOUNTER — Encounter: Payer: Self-pay | Admitting: Physical Therapy

## 2016-06-28 DIAGNOSIS — M6281 Muscle weakness (generalized): Secondary | ICD-10-CM | POA: Diagnosis present

## 2016-06-28 DIAGNOSIS — R262 Difficulty in walking, not elsewhere classified: Secondary | ICD-10-CM | POA: Diagnosis not present

## 2016-06-28 NOTE — Therapy (Signed)
[address] [place], [place], [address] Phone: [phone]   Fax:  [phone]  Physical Therapy Treatment  Patient Details  Name: Brian Suarez MRN: [id number] Date of Birth: March 30, 1946 Referring Provider: Jani Gravel  Encounter Date: 06/28/2016      PT End of Session - 06/28/16 1140    Visit Number 14   Date for PT Re-Evaluation 07/11/16   PT Start Time 1100   PT Stop Time 1140   PT Time Calculation (min) 40 min   Activity Tolerance Patient tolerated treatment well   Behavior During Therapy [facility] for tasks assessed/performed      Past Medical History:  Diagnosis Date  . Chronic renal insufficiency   . Gout   . Heart transplanted ([place])   . Hypercholesteremia   . Hypertension   . OSA (obstructive sleep apnea)     Past Surgical History:  Procedure Laterality Date  . CARDIAC SURGERY  2005   heart cath  . CYSTECTOMY     EPIDERMOID  . ENDOMYOCARDIAL BIOPSY  2005  . HEART TRANSPLANT  2002  . HERNIA REPAIR    . HERNIA REPAIR    . PERICARDIOCENTESIS  2010  . PERICARDIUM SURGERY  2002   BX     There were no vitals filed for this visit.      Subjective Assessment - 06/28/16 1059    Subjective "Doing ok"   Currently in Pain? No/denies   Pain Score 0-No pain                         [facility] Adult PT Treatment/Exercise - 06/28/16 0001      Lumbar Exercises: Aerobic   Elliptical NuStep L6 x6    UBE (Upper Arm Bike) L3 54fd/3rev     Lumbar Exercises: Standing   Row 10 reps;Strengthening;Power tower;Both   Row Limitations 25   Other Standing Lumbar Exercises standing march on airex x10     Lumbar Exercises: Seated   Sit to Stand 10 reps  x2 no, UE support      Knee/Hip Exercises: Aerobic   Nustep L 6 6 min   Other Aerobic UBE L 4 3 fwd/3 back     Knee/Hip Exercises: Machines for Strengthening   Cybex Knee Extension 15lb 2x15   Cybex Knee Flexion 35# 2x15   Cybex Leg  Press 50# 3 sets 10   Other Machine lat pull 35lb and seated row 35lb 3 sets 10                  PT Short Term Goals - 05/24/16 1216      PT SHORT TERM GOAL #1   Title independent with safety and falls management   Status Achieved           PT Long Term Goals - 06/28/16 1132      PT LONG TERM GOAL #1   Title independent with HEP   Status Achieved     PT LONG TERM GOAL #2   Title walk 150 feet without device   Status Achieved     PT LONG TERM GOAL #3   Title increase Berg balance test to 46/56   Status Achieved     PT LONG TERM GOAL #4   Title increase hip strength to 4/5   Status Partially Met     PT LONG TERM GOAL #5   Title tolerate 45 minutes of activity  without rest   Status Achieved               Plan - 06/28/16 1140    Clinical Impression Statement Pt performed all of today's interventions well.  Pt has made progress towards all goals and has most of his LTG's. Pt does have some weakness in his hips.  Pt reports that he will continue to stay active on his own.   Rehab Potential Good   PT Frequency 2x / week   PT Duration 8 weeks   PT Treatment/Interventions ADLs/Self Care Home Management;Gait training;Functional mobility training;Therapeutic activities;Therapeutic exercise;Neuromuscular re-education;Patient/family education   PT Next Visit Plan D/C      Patient will benefit from skilled therapeutic intervention in order to improve the following deficits and impairments:  Cardiopulmonary status limiting activity, Decreased activity tolerance, Decreased balance, Decreased mobility, Decreased endurance, Decreased strength, Difficulty walking  Visit Diagnosis: Difficulty in walking, not elsewhere classified  Muscle weakness (generalized)     Problem List Patient Active Problem List   Diagnosis Date Noted  . OSA (obstructive sleep apnea) 05/29/2012   PHYSICAL THERAPY DISCHARGE SUMMARY  Visits from Start of Care: 15 Plan: Patient  agrees to discharge.  Patient goals were partially met. Patient is being discharged due to being pleased with the current functional level.  ?????        Scot Jun 06/28/2016, 11:51 AM  [address] [place], [place], [address] Phone: [phone]   Fax:  [phone]  Name: Brian Suarez MRN: [id number] Date of Birth: 1946-08-05

## 2016-07-27 ENCOUNTER — Ambulatory Visit: Payer: Medicare Other | Admitting: Internal Medicine

## 2016-07-31 ENCOUNTER — Encounter: Payer: Self-pay | Admitting: Internal Medicine

## 2016-07-31 ENCOUNTER — Ambulatory Visit (INDEPENDENT_AMBULATORY_CARE_PROVIDER_SITE_OTHER): Payer: Medicare Other | Admitting: Internal Medicine

## 2016-07-31 VITALS — BP 128/74 | HR 101 | Ht 74.0 in | Wt 190.6 lb

## 2016-07-31 DIAGNOSIS — Z941 Heart transplant status: Secondary | ICD-10-CM | POA: Diagnosis not present

## 2016-07-31 DIAGNOSIS — G4733 Obstructive sleep apnea (adult) (pediatric): Secondary | ICD-10-CM

## 2016-07-31 NOTE — Patient Instructions (Signed)
Order- [facility]   We can continue CPAP auto 5-20, mask of choice, humidifier, supplies, AirView   Dx OSA    Please call if we can help

## 2016-07-31 NOTE — Assessment & Plan Note (Signed)
Download confirms excellent compliance and control now. He feels he sleeps better with CPAP and denies problems. We discussed sleep hygiene. He will continue current AutoPap 5-20

## 2016-07-31 NOTE — Progress Notes (Signed)
Subjective:    Patient ID: Brian Suarez, male    DOB: [birth year], 70 y.o.   MRN: [id number]  HPI M Never smoker followed for OSA, complicated by history heart transplant/CellCept/cyclosporine  HPI  06/02/14- Dr Gwenette Greet The patient comes in today for followup of his obstructive sleep apnea. He is wearing CPAP compliantly by his history, but feels that he is having some breakthrough snoring and possibly breakthrough apnea. He feels that he is not resting quite as well, but admits that he can awaken and not get back to sleep for a few hours. His machine is only 19-4 years old, and his weight is actually down 5 pounds from the last visit. He has not been keeping up with his mask cushion changes as frequently as he needs to.  07/25/15- 69 yoM never smoker followed for OSA complicated by hx heart Xplant/CellCept/cyclosporine FOLLOWS FOR:  Wears CPAP 7 x 6-7 hours per night. Denies problems with mask or pressure. DME [facility]  07/31/2016-70 year old male never smoker followed for OSA, complicated by history heart transplant/CellCept/cyclosporine CPAP auto 5-20 /[facility] FOLLOWS FOR: DME [facility]. Pt. wears CPAP nightly,no supplies needed.DL attached. Hospitalized for a month with complications of hernia surgery/urinary infection at [facility] this summer. During that time he did not wear CPAP. Since then he has used it all night every night and download confirms 100% 4 hour compliance with excellent control AHI 1.4/hour. He sleeps better with CPAP and denies that he ever feels pressure is too high or too low.  ROS-see HPI   Negative unless "+" Constitutional:    weight loss, night sweats, fevers, chills, fatigue, lassitude. HEENT:    headaches, difficulty swallowing, tooth/dental problems, sore throat,       sneezing, itching, ear ache, nasal congestion, post nasal drip, snoring CV:    chest pain, orthopnea, PND, swelling in lower extremities, anasarca,                                              dizziness, palpitations Resp:   shortness of breath with exertion or at rest.                      productive cough,   non-productive cough, coughing up of blood.              change in color of mucus.  wheezing.   Skin:    rash or lesions. GI:  No-   heartburn, indigestion, abdominal pain, nausea, vomiting, diarrhea,                 change in bowel habits, loss of appetite GU: dysuria, change in color of urine, no urgency or frequency.   flank pain. MS:   joint pain, stiffness, decreased range of motion, back pain. Neuro-     nothing unusual Psych:  change in mood or affect.  depression or anxiety.   memory loss.      Objective:   OBJ- Physical Exam General- Alert, Oriented, Affect-appropriate, Distress- none acute, + Frail, elderly appearing man Skin- rash-none, lesions- none, excoriation- none Lymphadenopathy- none Head- atraumatic            Eyes- Gross vision intact, PERRLA, conjunctivae and secretions clear            Ears- Hearing, canals-normal  Nose- Clear, no-Septal dev, mucus, polyps, erosion, perforation             Throat- Mallampati II , mucosa clear , drainage- none, tonsils- atrophic Neck- flexible , trachea midline, no stridor , thyroid nl, carotid no bruit Chest - symmetrical excursion , unlabored           Heart/CV- RRR , no murmur , no gallop  , no rub, nl s1 s2                           - JVD- none , edema- none, stasis changes- none, varices- none           Lung- clear to P&A, wheeze- none, cough- none , dullness-none, rub- none           Chest wall-  Abd-  Br/ Gen/ Rectal- Not done, not indicated Extrem- cyanosis- none, clubbing, none, atrophy- none, strength- nl, + cane Neuro- grossly intact to observation    Assessment & Plan:

## 2016-07-31 NOTE — Assessment & Plan Note (Signed)
Being managed by [facility] transplant program chronic immunosuppressive therapy

## 2017-04-12 ENCOUNTER — Other Ambulatory Visit (HOSPITAL_COMMUNITY): Payer: Self-pay | Admitting: Nephrology

## 2017-04-12 DIAGNOSIS — R808 Other proteinuria: Secondary | ICD-10-CM

## 2017-04-16 ENCOUNTER — Encounter (HOSPITAL_COMMUNITY): Payer: Self-pay

## 2017-04-16 ENCOUNTER — Ambulatory Visit (HOSPITAL_COMMUNITY)
Admission: RE | Admit: 2017-04-16 | Discharge: 2017-04-16 | Disposition: A | Discharge status: Home / Self Care | Payer: Medicare Other | Source: Ambulatory Visit | Attending: Nephrology | Admitting: Nephrology

## 2017-04-16 DIAGNOSIS — N189 Chronic kidney disease, unspecified: Secondary | ICD-10-CM | POA: Insufficient documentation

## 2017-04-16 DIAGNOSIS — R808 Other proteinuria: Secondary | ICD-10-CM | POA: Insufficient documentation

## 2017-04-16 DIAGNOSIS — G4733 Obstructive sleep apnea (adult) (pediatric): Secondary | ICD-10-CM | POA: Diagnosis not present

## 2017-04-16 DIAGNOSIS — E7800 Pure hypercholesterolemia, unspecified: Secondary | ICD-10-CM | POA: Insufficient documentation

## 2017-04-16 DIAGNOSIS — Z941 Heart transplant status: Secondary | ICD-10-CM | POA: Insufficient documentation

## 2017-04-16 DIAGNOSIS — I129 Hypertensive chronic kidney disease with stage 1 through stage 4 chronic kidney disease, or unspecified chronic kidney disease: Secondary | ICD-10-CM | POA: Insufficient documentation

## 2017-04-16 DIAGNOSIS — M109 Gout, unspecified: Secondary | ICD-10-CM | POA: Insufficient documentation

## 2017-04-16 LAB — CBC
HEMATOCRIT: 41.8 % (ref 39.0–52.0)
HEMOGLOBIN: 14.4 g/dL (ref 13.0–17.0)
MCH: 28.9 pg (ref 26.0–34.0)
MCHC: 34.4 g/dL (ref 30.0–36.0)
MCV: 83.9 fL (ref 78.0–100.0)
Platelets: 149 10*3/uL — ABNORMAL LOW (ref 150–400)
RBC: 4.98 MIL/uL (ref 4.22–5.81)
RDW: 15 % (ref 11.5–15.5)
WBC: 7.9 10*3/uL (ref 4.0–10.5)

## 2017-04-16 LAB — PROTIME-INR
INR: 1.05
PROTHROMBIN TIME: 13.7 s (ref 11.4–15.2)

## 2017-04-16 MED ORDER — FENTANYL CITRATE (PF) 100 MCG/2ML IJ SOLN
INTRAMUSCULAR | Status: AC
  Filled 2017-04-16: qty 2

## 2017-04-16 MED ORDER — HYDRALAZINE HCL 20 MG/ML IJ SOLN
INTRAMUSCULAR | Status: AC
  Filled 2017-04-16: qty 1

## 2017-04-16 MED ORDER — SODIUM CHLORIDE 0.9 % IV SOLN
INTRAVENOUS | Status: AC | PRN
  Administered 2017-04-16: 10 mL/h via INTRAVENOUS

## 2017-04-16 MED ORDER — HYDRALAZINE HCL 20 MG/ML IJ SOLN
10.0000 mg | Freq: Once | INTRAMUSCULAR | Status: AC
  Administered 2017-04-16: 10 mg via INTRAVENOUS

## 2017-04-16 MED ORDER — HYDRALAZINE HCL 20 MG/ML IJ SOLN
INTRAMUSCULAR | Status: AC
  Administered 2017-04-16: 10 mg
  Filled 2017-04-16: qty 1

## 2017-04-16 MED ORDER — GELATIN ABSORBABLE 12-7 MM EX MISC
CUTANEOUS | Status: AC
  Filled 2017-04-16: qty 1

## 2017-04-16 MED ORDER — LIDOCAINE HCL (PF) 1 % IJ SOLN
INTRAMUSCULAR | Status: AC
  Filled 2017-04-16: qty 30

## 2017-04-16 MED ORDER — FENTANYL CITRATE (PF) 100 MCG/2ML IJ SOLN
INTRAMUSCULAR | Status: AC | PRN
  Administered 2017-04-16: 50 ug via INTRAVENOUS
  Administered 2017-04-16: 25 ug via INTRAVENOUS

## 2017-04-16 MED ORDER — MIDAZOLAM HCL 2 MG/2ML IJ SOLN
INTRAMUSCULAR | Status: AC
  Filled 2017-04-16: qty 2

## 2017-04-16 MED ORDER — MIDAZOLAM HCL 2 MG/2ML IJ SOLN
INTRAMUSCULAR | Status: AC | PRN
  Administered 2017-04-16: 1 mg via INTRAVENOUS
  Administered 2017-04-16: 0.5 mg via INTRAVENOUS

## 2017-04-16 NOTE — Procedures (Signed)
Proteinuria  S/p [place] LEFT RENAL BX  No comp Stable ebl 5cc Path pending Full report in PACS

## 2017-04-16 NOTE — H&P (Signed)
Chief Complaint: proteinuria  Referring Physician:Dr. Pearson Grippe  Supervising Physician: Daryll Brod  Patient Status: [facility] - Out-pt  HPI: Brian Suarez is a 71 y.o. male with a history of heart transplant 16 years ago.  Over the last couple of weeks he has been found to have rapidly worsening kidney function and protein in his urine consistent with nephrotic syndrome.  He has HTN as well as his BP med has been essentially doubled and then doubled again in the last 3 weeks.  He presents today after being followed by Dr. Joelyn Oms with nephrology for a random renal biopsy.  Past Medical History:  Past Medical History:  Diagnosis Date  . Chronic renal insufficiency   . Gout   . Heart transplanted ([place])   . Hypercholesteremia   . Hypertension   . OSA (obstructive sleep apnea)     Past Surgical History:  Past Surgical History:  Procedure Laterality Date  . CARDIAC SURGERY  2005   heart cath  . CYSTECTOMY     EPIDERMOID  . ENDOMYOCARDIAL BIOPSY  2005  . HEART TRANSPLANT  2002  . HERNIA REPAIR    . HERNIA REPAIR    . PERICARDIOCENTESIS  2010  . PERICARDIUM SURGERY  2002   BX     Family History:  Family History  Problem Relation Age of Onset  . Dementia Mother   . Other Mother        HYPOTENSTION  . Throat cancer Father     Social History:  reports that he has never smoked. He has never used smokeless tobacco. He reports that he does not drink alcohol or use drugs.  Allergies:  Allergies  Allergen Reactions  . Rapamune [Sirolimus] Other (See Comments)    Pt states that this medication causes headaches.   . Verapamil Other (See Comments)    Reaction: Rash and ulcers    Medications: Medications reviewed in epic.  Please HPI for pertinent positives, otherwise complete 10 system ROS negative.  Mallampati Score: MD Evaluation Airway: WNL Heart: WNL Abdomen: WNL Chest/ Lungs: WNL ASA  Classification: 3 Mallampati/Airway Score: One  Physical Exam: BP  (!) 157/97   Pulse 87   Temp 98.5 F (36.9 C) (Oral)   Resp 18   Ht 6\' 2"  (1.88 m)   Wt 215 lb (97.5 kg)   SpO2 99%   BMI 27.60 kg/m  Body mass index is 27.6 kg/m. General: pleasant, WD, WN white male who is laying in bed in NAD HEENT: head is normocephalic, atraumatic.  Sclera are noninjected.  PERRL.  Ears and nose without any masses or lesions.  Mouth is pink and moist Heart: regular, rate, and rhythm.  Normal s1,s2. No obvious murmurs, gallops, or rubs noted.  Palpable radial and pedal pulses bilaterally Lungs: CTAB, no wheezes, rhonchi, or rales noted.  Respiratory effort nonlabored Abd: soft, NT, ND, obese, +BS, no masses, hernias, or organomegaly Psych: A&Ox3 with an appropriate affect.   Labs: Results for orders placed or performed during the hospital encounter of 04/16/17 (from the past 48 hour(s))  CBC     Status: Abnormal   Collection Time: 04/16/17  6:00 AM  Result Value Ref Range   WBC 7.9 4.0 - 10.5 K/uL   RBC 4.98 4.22 - 5.81 MIL/uL   Hemoglobin 14.4 13.0 - 17.0 g/dL   HCT 41.8 39.0 - 52.0 %   MCV 83.9 78.0 - 100.0 fL   MCH 28.9 26.0 - 34.0 pg   MCHC 34.4  30.0 - 36.0 g/dL   RDW 15.0 11.5 - 15.5 %   Platelets 149 (L) 150 - 400 K/uL  Protime-INR     Status: None   Collection Time: 04/16/17  6:00 AM  Result Value Ref Range   Prothrombin Time 13.7 11.4 - 15.2 seconds   INR 1.05     Imaging: No results found.  Assessment/Plan 1. Proteinuria  We will plan to proceed with a random renal biopsy.  However, his BP today is 160/97.  He has been given 10 mg of hydralazine so far.  He did take his medications this morning already.  We would like it 150/90 or below prior to starting to minimize his risk of bleeding complications.  If this improves, then we will proceed. Risks and benefits discussed with the patient including, but not limited to bleeding, infection, damage to adjacent structures or low yield requiring additional tests. All of the patient's questions  were answered, patient is agreeable to proceed. Consent signed and in chart.  Thank you for this interesting consult.  I greatly enjoyed meeting Brian Suarez and look forward to participating in their care.  A copy of this report was sent to the requesting provider on this date.  Electronically Signed: Henreitta Cea 04/16/2017, 8:07 AM   I spent a total of  30 Minutes   in face to face in clinical consultation, greater than 50% of which was counseling/coordinating care for proteinuria

## 2017-04-16 NOTE — Discharge Instructions (Addendum)

## 2017-04-24 ENCOUNTER — Encounter (HOSPITAL_COMMUNITY): Payer: Self-pay

## 2017-05-02 ENCOUNTER — Encounter (HOSPITAL_COMMUNITY): Payer: Self-pay

## 2017-05-22 ENCOUNTER — Other Ambulatory Visit: Payer: Self-pay | Admitting: Internal Medicine

## 2017-05-22 DIAGNOSIS — M25551 Pain in right hip: Secondary | ICD-10-CM

## 2017-05-23 ENCOUNTER — Other Ambulatory Visit (HOSPITAL_COMMUNITY): Payer: Self-pay | Admitting: Internal Medicine

## 2017-06-01 ENCOUNTER — Ambulatory Visit
Admission: RE | Admit: 2017-06-01 | Discharge: 2017-06-01 | Disposition: A | Discharge status: Home / Self Care | Payer: Medicare Other | Source: Ambulatory Visit | Attending: Internal Medicine | Admitting: Internal Medicine

## 2017-06-01 DIAGNOSIS — M25551 Pain in right hip: Secondary | ICD-10-CM

## 2017-09-26 ENCOUNTER — Ambulatory Visit: Payer: Medicare Other | Admitting: Internal Medicine

## 2017-09-26 ENCOUNTER — Encounter: Payer: Self-pay | Admitting: Internal Medicine

## 2017-09-26 DIAGNOSIS — Z941 Heart transplant status: Secondary | ICD-10-CM

## 2017-09-26 DIAGNOSIS — N184 Chronic kidney disease, stage 4 (severe): Secondary | ICD-10-CM

## 2017-09-26 DIAGNOSIS — G4733 Obstructive sleep apnea (adult) (pediatric): Secondary | ICD-10-CM

## 2017-09-26 DIAGNOSIS — N179 Acute kidney failure, unspecified: Secondary | ICD-10-CM | POA: Insufficient documentation

## 2017-09-26 NOTE — Assessment & Plan Note (Signed)
AutoPap 5-20 works very well for him.  He feels much better using CPAP.  Good compliance and control are confirmed by download.  No changes required.

## 2017-09-26 NOTE — Patient Instructions (Signed)
We can continue CPAP auto 5-20, mask of choice humidifier, supplies, AirView  Please call if we can help

## 2017-09-26 NOTE — Assessment & Plan Note (Signed)
He is anticipating he will need dialysis

## 2017-09-26 NOTE — Assessment & Plan Note (Signed)
He continues long-term follow-up at [facility] for his heart transplant and for his chronic kidney disease.

## 2017-09-26 NOTE — Progress Notes (Signed)
Subjective:    Patient ID: Brian Suarez, male    DOB: May 12, 1946, 72 y.o.   MRN: [id number]  HPI M Never smoker followed for OSA, complicated by history heart transplant/CellCept/cyclosporine, HBP, gout   ------------------------------------------------------------------------- 07/31/2016-72 year old male never smoker followed for OSA, complicated by history heart transplant/CellCept/cyclosporine CPAP auto 5-20 /[facility] FOLLOWS FOR: DME [facility]. Pt. wears CPAP nightly,no supplies needed.DL attached. Hospitalized for a month with complications of hernia surgery/urinary infection at [facility] this summer. During that time he did not wear CPAP. Since then he has used it all night every night and download confirms 100% 4 hour compliance with excellent control AHI 1.4/hour. He sleeps better with CPAP and denies that he ever feels pressure is too high or too low.  09/26/17- 72 year old male never smoker followed for OSA, complicated by history heart transplant/CellCept/cyclosporine, HBP, gout, CKD CPAP auto 5-20 [place] ----OSA: DME; [facility]. Pt wears CPAP nightly and DL attached. No new supplies needed at this time and pressure works well for patient.  Heart transplant is working well but he is developing significant chronic kidney disease anticipating he may require dialysis. Very comfortable with his CPAP, sleeping well with it and definitely feels he benefits.  Pressures are comfortable.  Download 96% compliance, AHI 1.0/hour.  ROS-see HPI   + = positive Constitutional:    weight loss, night sweats, fevers, chills, fatigue, lassitude. HEENT:    headaches, difficulty swallowing, tooth/dental problems, sore throat,       sneezing, itching, ear ache, nasal congestion, post nasal drip, snoring CV:    chest pain, orthopnea, PND, swelling in lower extremities, anasarca,                                             dizziness, palpitations Resp:    shortness of breath with exertion or at rest.                      productive cough,   non-productive cough, coughing up of blood.              change in color of mucus.  wheezing.   Skin:    rash or lesions. GI:  No-   heartburn, indigestion, abdominal pain, nausea, vomiting, diarrhea,                 change in bowel habits, loss of appetite GU: dysuria, change in color of urine, no urgency or frequency.   flank pain. MS:   joint pain, stiffness, decreased range of motion, back pain. Neuro-     nothing unusual Psych:  change in mood or affect.  depression or anxiety.   memory loss.    Objective:   OBJ- Physical Exam General- Alert, Oriented, Affect-appropriate, Distress- none acute,  Skin- rash-none, lesions- none, excoriation- none Lymphadenopathy- none Head- atraumatic            Eyes- Gross vision intact, PERRLA, conjunctivae and secretions clear            Ears- Hearing, canals-normal            Nose- Clear, no-Septal dev, mucus, polyps, erosion, perforation             Throat- Mallampati II , mucosa clear , drainage- none, tonsils- atrophic Neck- flexible , trachea midline, no stridor , thyroid nl, carotid  no bruit Chest - symmetrical excursion , unlabored           Heart/CV- RRR , no murmur , no gallop  , no rub, nl s1 s2                           - JVD- none , edema- none, stasis changes- none, varices- none           Lung- clear to P&A, wheeze- none, cough- none , dullness-none, rub- none           Chest wall-  Abd-  Br/ Gen/ Rectal- Not done, not indicated Extrem- cyanosis- none, clubbing, none, atrophy- none, strength- nl, + cane Neuro- grossly intact to observation    Assessment & Plan:

## 2018-04-29 ENCOUNTER — Observation Stay (HOSPITAL_COMMUNITY)
Admission: EM | Admit: 2018-04-29 | Discharge: 2018-05-01 | Disposition: A | Discharge status: Home / Self Care | Payer: Medicare Other | Attending: Family Medicine | Admitting: Family Medicine

## 2018-04-29 ENCOUNTER — Ambulatory Visit (INDEPENDENT_AMBULATORY_CARE_PROVIDER_SITE_OTHER)
Admission: EM | Admit: 2018-04-29 | Discharge: 2018-04-29 | Disposition: A | Discharge status: Home / Self Care | Payer: Medicare Other | Source: Home / Self Care | Attending: Family Medicine | Admitting: Family Medicine

## 2018-04-29 ENCOUNTER — Emergency Department (HOSPITAL_COMMUNITY): Payer: Medicare Other

## 2018-04-29 ENCOUNTER — Encounter (HOSPITAL_COMMUNITY): Payer: Self-pay | Admitting: Emergency Medicine

## 2018-04-29 ENCOUNTER — Other Ambulatory Visit: Payer: Self-pay

## 2018-04-29 DIAGNOSIS — Z941 Heart transplant status: Secondary | ICD-10-CM | POA: Insufficient documentation

## 2018-04-29 DIAGNOSIS — E0800 Diabetes mellitus due to underlying condition with hyperosmolarity without nonketotic hyperglycemic-hyperosmolar coma (NKHHC): Secondary | ICD-10-CM | POA: Diagnosis present

## 2018-04-29 DIAGNOSIS — I129 Hypertensive chronic kidney disease with stage 1 through stage 4 chronic kidney disease, or unspecified chronic kidney disease: Secondary | ICD-10-CM | POA: Diagnosis not present

## 2018-04-29 DIAGNOSIS — N184 Chronic kidney disease, stage 4 (severe): Secondary | ICD-10-CM | POA: Diagnosis not present

## 2018-04-29 DIAGNOSIS — Z794 Long term (current) use of insulin: Secondary | ICD-10-CM | POA: Insufficient documentation

## 2018-04-29 DIAGNOSIS — R739 Hyperglycemia, unspecified: Secondary | ICD-10-CM

## 2018-04-29 DIAGNOSIS — N179 Acute kidney failure, unspecified: Secondary | ICD-10-CM | POA: Diagnosis not present

## 2018-04-29 DIAGNOSIS — N390 Urinary tract infection, site not specified: Secondary | ICD-10-CM

## 2018-04-29 DIAGNOSIS — E1122 Type 2 diabetes mellitus with diabetic chronic kidney disease: Secondary | ICD-10-CM | POA: Diagnosis not present

## 2018-04-29 DIAGNOSIS — I10 Essential (primary) hypertension: Secondary | ICD-10-CM | POA: Diagnosis present

## 2018-04-29 DIAGNOSIS — Z79899 Other long term (current) drug therapy: Secondary | ICD-10-CM | POA: Insufficient documentation

## 2018-04-29 DIAGNOSIS — G4733 Obstructive sleep apnea (adult) (pediatric): Secondary | ICD-10-CM | POA: Diagnosis not present

## 2018-04-29 DIAGNOSIS — E7800 Pure hypercholesterolemia, unspecified: Secondary | ICD-10-CM | POA: Insufficient documentation

## 2018-04-29 DIAGNOSIS — R531 Weakness: Secondary | ICD-10-CM | POA: Diagnosis not present

## 2018-04-29 DIAGNOSIS — E1100 Type 2 diabetes mellitus with hyperosmolarity without nonketotic hyperglycemic-hyperosmolar coma (NKHHC): Secondary | ICD-10-CM | POA: Diagnosis present

## 2018-04-29 DIAGNOSIS — Z7982 Long term (current) use of aspirin: Secondary | ICD-10-CM | POA: Insufficient documentation

## 2018-04-29 DIAGNOSIS — E785 Hyperlipidemia, unspecified: Secondary | ICD-10-CM | POA: Insufficient documentation

## 2018-04-29 DIAGNOSIS — M109 Gout, unspecified: Secondary | ICD-10-CM | POA: Diagnosis not present

## 2018-04-29 DIAGNOSIS — R7303 Prediabetes: Secondary | ICD-10-CM | POA: Diagnosis present

## 2018-04-29 LAB — CBC
HCT: 40.2 % (ref 39.0–52.0)
HEMOGLOBIN: 13 g/dL (ref 13.0–17.0)
MCH: 27.7 pg (ref 26.0–34.0)
MCHC: 32.3 g/dL (ref 30.0–36.0)
MCV: 85.5 fL (ref 78.0–100.0)
Platelets: 156 10*3/uL (ref 150–400)
RBC: 4.7 MIL/uL (ref 4.22–5.81)
RDW: 14.2 % (ref 11.5–15.5)
WBC: 8.3 10*3/uL (ref 4.0–10.5)

## 2018-04-29 LAB — URINALYSIS, ROUTINE W REFLEX MICROSCOPIC
BILIRUBIN URINE: NEGATIVE
Bacteria, UA: NONE SEEN
Glucose, UA: >=500 mg/dL — AB
Ketones, ur: NEGATIVE mg/dL
Leukocytes, UA: NEGATIVE
NITRITE: NEGATIVE
PROTEIN: 30 mg/dL — AB
Specific Gravity, Urine: 1.015 (ref 1.005–1.030)
pH: 5 (ref 5.0–8.0)

## 2018-04-29 LAB — BASIC METABOLIC PANEL
ANION GAP: 16 — AB (ref 5–15)
BUN: 102 mg/dL — ABNORMAL HIGH (ref 8–23)
CHLORIDE: 78 mmol/L — AB (ref 98–111)
CO2: 26 mmol/L (ref 22–32)
Calcium: 10.1 mg/dL (ref 8.9–10.3)
Creatinine, Ser: 4.71 mg/dL — ABNORMAL HIGH (ref 0.61–1.24)
GFR calc non Af Amer: 11 mL/min — ABNORMAL LOW (ref >60)
GFR, EST AFRICAN AMERICAN: 13 mL/min — AB (ref >60)
GLUCOSE: 1013 mg/dL — AB (ref 70–99)
Potassium: 4.1 mmol/L (ref 3.5–5.1)
Sodium: 120 mmol/L — ABNORMAL LOW (ref 135–145)

## 2018-04-29 LAB — CBG MONITORING, ED: Glucose-Capillary: >600 mg/dL (ref 70–99)

## 2018-04-29 LAB — GLUCOSE, CAPILLARY: Glucose-Capillary: >600 mg/dL (ref 70–99)

## 2018-04-29 MED ORDER — DEXTROSE-NACL 5-0.45 % IV SOLN: INTRAVENOUS | Status: DC

## 2018-04-29 MED ORDER — SODIUM CHLORIDE 0.9 % IV BOLUS
1000.0000 mL | Freq: Once | INTRAVENOUS | Status: AC
  Administered 2018-04-29: 1000 mL via INTRAVENOUS

## 2018-04-29 MED ORDER — SODIUM CHLORIDE 0.9 % IV SOLN
INTRAVENOUS | Status: DC
  Administered 2018-04-29: 5.4 [IU]/h via INTRAVENOUS
  Filled 2018-04-29 (×2): qty 1

## 2018-04-29 NOTE — ED Provider Notes (Addendum)
[place]    CSN: [id number] Arrival date & time: 04/29/18  1846     History   Chief Complaint Chief Complaint  Patient presents with  . Hyperglycemia    HPI Brian Suarez is a 72 y.o. male.   HPI  Patient is here with his wife.  They give accurate history and bring records.  They went to the primary care doctor today.  Patient has been diagnosed as prediabetes.  As part of his evaluation today he had a urinalysis.  This showed hyperglycemia.  Because of some mechanical failure, they were unable to do a blood sugar.  They told him to go to the urgent care center to get a blood sugar done.  They gave him a prescription for insulin, and a shot of insulin in the office.  He is here to get blood work done.  He states that he feels well. A fingerstick blood sugar was done upon arrival.  It was over 600.  We tried to encourage patient to go on down to the emergency room for care.  Patient and wife refused.  They feel like the insulin he has a prescription for will treat him.  I did call his attending physician, Dr. Jani Gravel.  Dr. Maudie Mercury agrees with me that the patient needs to go down to the emergency room with a blood sugar this high.  I discussed this with patient and his wife in the agreed to go to the ER, reluctantly.  Past Medical History:  Diagnosis Date  . Chronic renal insufficiency   . Gout   . Heart transplanted ([place])   . Hypercholesteremia   . Hypertension   . OSA (obstructive sleep apnea)     Patient Active Problem List   Diagnosis Date Noted  . Type 2 diabetes mellitus with hyperosmolar nonketotic hyperglycemia ([place]) 04/30/2018  . Essential hypertension 04/30/2018  . Gout 04/30/2018  . Prediabetes 04/30/2018  . HLD (hyperlipidemia) 04/30/2018  . Diabetes mellitus due to underlying condition with hyperosmolarity without nonketotic hyperglycemic-hyperosmolar coma [facility]) ([place]) 04/30/2018  . Acute renal failure superimposed on stage 4 chronic kidney disease  ([place]) 09/26/2017  . Heart transplant recipient [facility]) 07/31/2016  . OSA (obstructive sleep apnea) 05/29/2012    Past Surgical History:  Procedure Laterality Date  . CARDIAC SURGERY  2005   heart cath  . CYSTECTOMY     EPIDERMOID  . ENDOMYOCARDIAL BIOPSY  2005  . HEART TRANSPLANT  2002  . HERNIA REPAIR    . HERNIA REPAIR    . PERICARDIOCENTESIS  2010  . PERICARDIUM SURGERY  2002   BX        Home Medications    Prior to Admission medications   Medication Sig Start Date End Date Taking? Authorizing Provider  aspirin EC 81 MG tablet Take 81 mg by mouth daily.   Yes [provider]  atorvastatin (LIPITOR) 40 MG tablet Take 40 mg by mouth every evening.    Yes [provider]  calcitRIOL (ROCALTROL) 0.25 MCG capsule Take 0.25 mcg by mouth daily.   Yes [provider]  calcium-vitamin D (OSCAL WITH D) 500-200 MG-UNIT per tablet Take 1 tablet by mouth daily.   Yes [provider]  carvedilol (COREG) 12.5 MG tablet Take 12.5 mg by mouth 2 (two) times daily with a meal.   Yes [provider]  fish oil-omega-3 fatty acids 1000 MG capsule Take 2 g by mouth 2 (two) times daily.    Yes [provider]  furosemide (LASIX) 40 MG tablet Take 40-80 mg by mouth 2 (two) times daily.    Yes [provider]  hydrALAZINE (APRESOLINE) 50 MG tablet Take 50 mg by mouth 2 (two) times daily.   Yes [provider]  Multiple Vitamin (MULTIVITAMIN WITH MINERALS) TABS Take 1 tablet by mouth daily.   Yes [provider]  mycophenolate (CELLCEPT) 500 MG tablet Take 500 mg by mouth 2 (two) times daily.    Yes [provider]  sennosides-docusate sodium (SENOKOT-S) 8.6-50 MG tablet Take 1-2 tablets by mouth 2 (two) times daily as needed for constipation.    Yes [provider]  sirolimus (RAPAMUNE) 1 MG tablet Take 1 tablet by mouth every other day.  07/23/17 07/23/18 Yes [provider]  allopurinol  (ZYLOPRIM) 300 MG tablet Take 300 mg by mouth daily.    [provider]  magnesium oxide (MAG-OX) 400 MG tablet Take 400 mg by mouth every evening.     [provider]    Family History Family History  Problem Relation Age of Onset  . Dementia Mother   . Other Mother        HYPOTENSTION  . Throat cancer Father     Social History Social History   Tobacco Use  . Smoking status: Never Smoker  . Smokeless tobacco: Never Used  Substance Use Topics  . Alcohol use: No  . Drug use: No     Allergies   Rapamune [sirolimus] and Verapamil   Review of Systems Review of Systems  Constitutional: Negative for chills and fever.  HENT: Negative for ear pain and sore throat.   Eyes: Negative for pain and visual disturbance.  Respiratory: Negative for cough and shortness of breath.   Cardiovascular: Negative for chest pain and palpitations.  Gastrointestinal: Negative for abdominal pain and vomiting.  Endocrine: Positive for polyuria.  Genitourinary: Negative for dysuria and hematuria.  Musculoskeletal: Negative for arthralgias and back pain.  Skin: Negative for color change and rash.  Neurological: Negative for seizures and syncope.  All other systems reviewed and are negative.    Physical Exam Triage Vital Signs ED Triage Vitals [04/29/18 1941]  Enc Vitals Group     BP (!) 142/80     Pulse Rate 81     Resp 16     Temp 98 F (36.7 C)     Temp Source Oral     SpO2 97 %     Weight 202 lb (91.6 kg)     Height      Head Circumference      Peak Flow      Pain Score 0     Pain Loc      Pain Edu?      Excl. in [place]?    No data found.  Updated Vital Signs BP (!) 142/80   Pulse 81   Temp 98 F (36.7 C) (Oral)   Resp 16   Wt 91.6 kg   SpO2 97%   BMI 26.65 kg/m      Physical Exam  Constitutional: He appears well-developed and well-nourished. No distress.  Patient appears tired.  Small steps.  HENT:  Head: Normocephalic and atraumatic.  His  membranes are mildly dry  Eyes: Pupils are equal, round, and reactive to light. Conjunctivae are normal.  Neck: Normal range of motion.  Cardiovascular: Normal rate.  Pulmonary/Chest: Effort normal. No respiratory distress.  Abdominal: Soft. He exhibits no distension.  Musculoskeletal: Normal range of motion. He  exhibits no edema.  Neurological: He is alert.  Skin: Skin is warm and dry.     UC Treatments / Results  Labs (all labs ordered are listed, but only abnormal results are displayed) Labs Reviewed  GLUCOSE, CAPILLARY - Abnormal; Notable for the following components:      Result Value   Glucose-Capillary >600 (*)    All other components within normal limits    EKG None  Radiology Dg Chest Portable 1 View  Result Date: 04/30/2018 CLINICAL DATA:  Dyspnea, heart transplant 2002. EXAM: PORTABLE CHEST 1 VIEW COMPARISON:  11/20/2003 FINDINGS: Top-normal cardiac size. Mild aortic atherosclerosis at the arch without aneurysm. No pulmonary consolidation or CHF. No effusion or pneumothorax. Minimal basilar atelectasis on the left. No acute osseous abnormality. IMPRESSION: No active disease. Mild aortic atherosclerosis. Electronically Signed   By: Ashley Royalty M.D.   On: 04/30/2018 00:20    Procedures Procedures (including critical care time)  Medications Ordered in UC Medications - No data to display  Initial Impression / Assessment and Plan / UC Course  I have reviewed the triage vital signs and the nursing notes.  Pertinent labs & imaging results that were available during my care of the patient were reviewed by me and considered in my medical decision making (see chart for details).      Final Clinical Impressions(s) / UC Diagnoses   Final diagnoses:  Hyperglycemia     Discharge Instructions     To ER.  Wife agrees to take him by private vehicle.  I feel like this is safe.    ED Prescriptions    None     Controlled Substance Prescriptions [place] Controlled  Substance Registry consulted? Not Applicable   Raylene Everts, MD 04/29/18 2200    Raylene Everts, MD 04/30/18 1320

## 2018-04-29 NOTE — ED Provider Notes (Signed)
[facility] [facility] EMERGENCY DEPARTMENT Provider Note   CSN: [id number] Arrival date & time: 04/29/18  2016     History   Chief Complaint Chief Complaint  Patient presents with  . Hyperglycemia    HPI Brian Suarez is a 72 y.o. male.  HPI  This is a 72 year old male with a history of heart transplant, hypertension, chronic renal insufficiency who presents with hyperglycemia.  Patient was seen and evaluated by his primary doctor today.  Noted to be hyperglycemic.  He followed up at urgent care for a fingerstick which was greater than 600.  He was referred here for further management.  Previous diagnosis of prediabetes but has never been on insulin before.  Denies any recent infectious symptoms including fever, cough, urinary symptoms.  Denies chest pain, shortness of breath, abdominal pain, nausea, vomiting.  Does report generalized malaise over the last several days and decreased p.o. intake.  Reports urinary frequency but states that this is not new.  I reviewed the patient's chart.  Glucose greater than 600 at urgent care.  Metabolic panel here with glucose greater than 1000.  Anion gap is 16 but no ketones in the urine.  Patient is followed at [facility] for his heart [facility].  EF is preserved based on chart reviewed.  Past Medical History:  Diagnosis Date  . Chronic renal insufficiency   . Gout   . Heart transplanted ([place])   . Hypercholesteremia   . Hypertension   . OSA (obstructive sleep apnea)     Patient Active Problem List   Diagnosis Date Noted  . Type 2 diabetes mellitus with hyperosmolar nonketotic hyperglycemia ([place]) 04/30/2018  . CKD (chronic kidney disease) 09/26/2017  . Heart transplant recipient [facility]) 07/31/2016  . OSA (obstructive sleep apnea) 05/29/2012    Past Surgical History:  Procedure Laterality Date  . CARDIAC SURGERY  2005   heart cath  . CYSTECTOMY     EPIDERMOID  . ENDOMYOCARDIAL BIOPSY  2005  . HEART TRANSPLANT  2002  . HERNIA  REPAIR    . HERNIA REPAIR    . PERICARDIOCENTESIS  2010  . PERICARDIUM SURGERY  2002   BX         Home Medications    Prior to Admission medications   Medication Sig Start Date End Date Taking? Authorizing Provider  allopurinol (ZYLOPRIM) 300 MG tablet Take 300 mg by mouth daily.   Yes [provider]  aspirin EC 81 MG tablet Take 81 mg by mouth daily.   Yes [provider]  atorvastatin (LIPITOR) 40 MG tablet Take 40 mg by mouth every evening.    Yes [provider]  calcitRIOL (ROCALTROL) 0.25 MCG capsule Take 0.25 mcg by mouth daily.   Yes [provider]  calcium-vitamin D (OSCAL WITH D) 500-200 MG-UNIT per tablet Take 1 tablet by mouth daily.   Yes [provider]  carvedilol (COREG) 12.5 MG tablet Take 12.5 mg by mouth 2 (two) times daily with a meal.   Yes [provider]  fish oil-omega-3 fatty acids 1000 MG capsule Take 2 g by mouth 2 (two) times daily.    Yes [provider]  furosemide (LASIX) 40 MG tablet Take 40-80 mg by mouth 2 (two) times daily.    Yes [provider]  hydrALAZINE (APRESOLINE) 50 MG tablet Take 50 mg by mouth 2 (two) times daily.   Yes [provider]  magnesium oxide (MAG-OX) 400 MG tablet Take 400 mg by mouth every evening.  Yes [provider]  Multiple Vitamin (MULTIVITAMIN WITH MINERALS) TABS Take 1 tablet by mouth daily.   Yes [provider]  mycophenolate (CELLCEPT) 500 MG tablet Take 500 mg by mouth 2 (two) times daily.    Yes [provider]  sennosides-docusate sodium (SENOKOT-S) 8.6-50 MG tablet Take 1-2 tablets by mouth 2 (two) times daily as needed for constipation.    Yes [provider]  sirolimus (RAPAMUNE) 1 MG tablet Take 1 tablet by mouth every other day.  07/23/17 07/23/18 Yes [provider]    Family History Family History  Problem Relation Age of Onset  . Dementia Mother   . Other Mother         HYPOTENSTION  . Throat cancer Father     Social History Social History   Tobacco Use  . Smoking status: Never Smoker  . Smokeless tobacco: Never Used  Substance Use Topics  . Alcohol use: No  . Drug use: No     Allergies   Rapamune [sirolimus] and Verapamil   Review of Systems Review of Systems  Constitutional: Positive for appetite change. Negative for fever.  Respiratory: Negative for cough and shortness of breath.   Cardiovascular: Negative for chest pain.  Gastrointestinal: Negative for abdominal pain, nausea and vomiting.  Genitourinary: Positive for frequency. Negative for dysuria and urgency.  Skin: Negative for rash.  Psychiatric/Behavioral: Negative for confusion.  All other systems reviewed and are negative.    Physical Exam Updated Vital Signs BP (!) 164/96   Pulse 81   Temp 97.9 F (36.6 C) (Oral)   Resp 14   Ht 1.854 m (6\' 1" )   Wt 91.6 kg   SpO2 97%   BMI 26.65 kg/m   Physical Exam  Constitutional: He is oriented to person, place, and time. He appears well-developed and well-nourished.  Elderly, nontoxic-appearing  HENT:  Head: Normocephalic and atraumatic.  Mucous membranes dry  Eyes: Pupils are equal, round, and reactive to light.  Neck: Neck supple.  Cardiovascular: Normal rate, regular rhythm and normal heart sounds.  No murmur heard. Well-healing sternotomy scars  Pulmonary/Chest: Effort normal and breath sounds normal. No respiratory distress. He has no wheezes.  Abdominal: Soft. Bowel sounds are normal. There is no tenderness. There is no rebound.  Musculoskeletal: He exhibits no edema.  Lymphadenopathy:    He has no cervical adenopathy.  Neurological: He is alert and oriented to person, place, and time.  Skin: Skin is warm and dry.  Psychiatric: He has a normal mood and affect.  Nursing note and vitals reviewed.    ED Treatments / Results  Labs (all labs ordered are listed, but only abnormal results are displayed) Labs  Reviewed  BASIC METABOLIC PANEL - Abnormal; Notable for the following components:      Result Value   Sodium 120 (*)    Chloride 78 (*)    Glucose, Bld 1,013 (*)    BUN 102 (*)    Creatinine, Ser 4.71 (*)    GFR calc non Af Amer 11 (*)    GFR calc Af Amer 13 (*)    Anion gap 16 (*)    All other components within normal limits  URINALYSIS, ROUTINE W REFLEX MICROSCOPIC - Abnormal; Notable for the following components:   Glucose, UA >=500 (*)    Hgb urine dipstick SMALL (*)    Protein, ur 30 (*)    All other components within normal limits  CBG MONITORING, ED - Abnormal; Notable for the following  components:   Glucose-Capillary >600 (*)    All other components within normal limits  CBG MONITORING, ED - Abnormal; Notable for the following components:   Glucose-Capillary >600 (*)    All other components within normal limits  CBC  HEMOGLOBIN A1C  I-STAT TROPONIN, ED    EKG EKG Interpretation  Date/Time:  Tuesday April 29 2018 23:29:00 EDT Ventricular Rate:  79 PR Interval:    QRS Duration: 130 QT Interval:  409 QTC Calculation: 469 R Axis:   118 Text Interpretation:  Ectopic atrial rhythm Nonspecific intraventricular conduction delay Anterior infarct, age indeterminate Lateral leads are also involved Wandering baseline Confirmed by Thayer Jew [phone]) on 04/30/2018 12:12:56 AM   Radiology Dg Chest Portable 1 View  Result Date: 04/30/2018 CLINICAL DATA:  Dyspnea, heart transplant 2002. EXAM: PORTABLE CHEST 1 VIEW COMPARISON:  11/20/2003 FINDINGS: Top-normal cardiac size. Mild aortic atherosclerosis at the arch without aneurysm. No pulmonary consolidation or CHF. No effusion or pneumothorax. Minimal basilar atelectasis on the left. No acute osseous abnormality. IMPRESSION: No active disease. Mild aortic atherosclerosis. Electronically Signed   By: Ashley Royalty M.D.   On: 04/30/2018 00:20    Procedures Procedures (including critical care time)  CRITICAL CARE Performed  by: Merryl Hacker   Total critical care time: 45 minutes  Critical care time was exclusive of separately billable procedures and treating other patients.  Critical care was necessary to treat or prevent imminent or life-threatening deterioration.  Critical care was time spent personally by me on the following activities: development of treatment plan with patient and/or surrogate as well as nursing, discussions with consultants, evaluation of patient's response to treatment, examination of patient, obtaining history from patient or surrogate, ordering and performing treatments and interventions, ordering and review of laboratory studies, ordering and review of radiographic studies, pulse oximetry and re-evaluation of patient's condition.   Medications Ordered in ED Medications  insulin regular (NOVOLIN R,HUMULIN R) 100 Units in sodium chloride 0.9 % 100 mL (1 Units/mL) infusion (10.8 Units/hr Intravenous Rate/Dose Change 04/30/18 0053)  dextrose 5 %-0.45 % sodium chloride infusion (has no administration in time range)  sodium chloride 0.9 % bolus 1,000 mL (0 mLs Intravenous Stopped 04/30/18 0042)  sodium chloride 0.9 % bolus 1,000 mL (0 mLs Intravenous Stopped 04/30/18 0043)     Initial Impression / Assessment and Plan / ED Course  I have reviewed the triage vital signs and the nursing notes.  Pertinent labs & imaging results that were available during my care of the patient were reviewed by me and considered in my medical decision making (see chart for details).     Presents with hyperglycemia.  Reports history of prediabetes but no prior treatment for diabetes.  Only other symptoms include decreased appetite.  He clinically appears very dry.  Vital signs notable for blood pressure 164/96.  Patient was given fluids.  Preserved EF based on chart review.  Glucose stabilizer was initiated for glucose of greater than 1000.  He does have a slight anion gap at 16 but no ketones in his urine.   Doubt DKA.  Would be more suspicious of a nonketotic hyperosmolar state.  He denies any infectious symptoms.  Urinalysis without infection.  No evidence of pneumonia on chest x-ray.  EKG shows no evidence of acute ischemia and troponin is negative.  Do not suspect infarction and patient is otherwise asymptomatic.  Unclear etiology for acute increase in blood sugar.  Will admit for blood sugar management and further work-up.  Hemoglobin  A1c is pending.  This was discussed with admitting hospitalist.  I did discuss with the patient and his wife whether or not they prefer to be treated at [facility].  However, primary physician is here in [place] and they feel comfortable being treated here.    Final Clinical Impressions(s) / ED Diagnoses   Final diagnoses:  Diabetes mellitus with nonketotic hyperosmolarity [facility])    ED Discharge Orders    None       Dina Rich, Barbette Hair, MD 04/30/18 [phone]

## 2018-04-29 NOTE — ED Triage Notes (Signed)
PT sent by PCP for follow up bloodwork for newly diagnosed diabetes

## 2018-04-29 NOTE — ED Triage Notes (Signed)
PCP sent pt to urgent care for hyperglycemia after "giving me a shot".  Urgent care stated CBG was over 600 and they needed to come to ED.  Pt c/o weakness and rundown feelings, lack of appetite.  Hx of heart transplant, kidney problems.

## 2018-04-30 ENCOUNTER — Other Ambulatory Visit: Payer: Self-pay

## 2018-04-30 ENCOUNTER — Encounter (HOSPITAL_COMMUNITY): Payer: Self-pay

## 2018-04-30 DIAGNOSIS — R7303 Prediabetes: Secondary | ICD-10-CM | POA: Diagnosis present

## 2018-04-30 DIAGNOSIS — Z941 Heart transplant status: Secondary | ICD-10-CM

## 2018-04-30 DIAGNOSIS — E1100 Type 2 diabetes mellitus with hyperosmolarity without nonketotic hyperglycemic-hyperosmolar coma (NKHHC): Secondary | ICD-10-CM | POA: Diagnosis not present

## 2018-04-30 DIAGNOSIS — I10 Essential (primary) hypertension: Secondary | ICD-10-CM | POA: Diagnosis not present

## 2018-04-30 DIAGNOSIS — N179 Acute kidney failure, unspecified: Secondary | ICD-10-CM | POA: Diagnosis not present

## 2018-04-30 DIAGNOSIS — E785 Hyperlipidemia, unspecified: Secondary | ICD-10-CM | POA: Diagnosis present

## 2018-04-30 DIAGNOSIS — G4733 Obstructive sleep apnea (adult) (pediatric): Secondary | ICD-10-CM

## 2018-04-30 DIAGNOSIS — M109 Gout, unspecified: Secondary | ICD-10-CM | POA: Diagnosis present

## 2018-04-30 DIAGNOSIS — E0800 Diabetes mellitus due to underlying condition with hyperosmolarity without nonketotic hyperglycemic-hyperosmolar coma (NKHHC): Secondary | ICD-10-CM | POA: Diagnosis present

## 2018-04-30 LAB — BASIC METABOLIC PANEL
ANION GAP: 12 (ref 5–15)
Anion gap: 16 — ABNORMAL HIGH (ref 5–15)
BUN: 96 mg/dL — AB (ref 8–23)
BUN: 95 mg/dL — ABNORMAL HIGH (ref 8–23)
CHLORIDE: 92 mmol/L — AB (ref 98–111)
CO2: 24 mmol/L (ref 22–32)
CO2: 26 mmol/L (ref 22–32)
CREATININE: 4.45 mg/dL — AB (ref 0.61–1.24)
Calcium: 9.3 mg/dL (ref 8.9–10.3)
Calcium: 9 mg/dL (ref 8.9–10.3)
Chloride: 94 mmol/L — ABNORMAL LOW (ref 98–111)
Creatinine, Ser: 4.17 mg/dL — ABNORMAL HIGH (ref 0.61–1.24)
GFR calc Af Amer: 15 mL/min — ABNORMAL LOW (ref >60)
GFR calc non Af Amer: 13 mL/min — ABNORMAL LOW (ref >60)
GFR, EST AFRICAN AMERICAN: 14 mL/min — AB (ref >60)
GFR, EST NON AFRICAN AMERICAN: 12 mL/min — AB (ref >60)
GLUCOSE: 350 mg/dL — AB (ref 70–99)
GLUCOSE: 289 mg/dL — AB (ref 70–99)
POTASSIUM: 2.8 mmol/L — AB (ref 3.5–5.1)
POTASSIUM: 3.7 mmol/L (ref 3.5–5.1)
Sodium: 132 mmol/L — ABNORMAL LOW (ref 135–145)
Sodium: 132 mmol/L — ABNORMAL LOW (ref 135–145)

## 2018-04-30 LAB — GLUCOSE, CAPILLARY
GLUCOSE-CAPILLARY: 108 mg/dL — AB (ref 70–99)
GLUCOSE-CAPILLARY: 137 mg/dL — AB (ref 70–99)
GLUCOSE-CAPILLARY: 252 mg/dL — AB (ref 70–99)
GLUCOSE-CAPILLARY: 304 mg/dL — AB (ref 70–99)
Glucose-Capillary: >600 mg/dL (ref 70–99)
Glucose-Capillary: 220 mg/dL — ABNORMAL HIGH (ref 70–99)
Glucose-Capillary: 152 mg/dL — ABNORMAL HIGH (ref 70–99)
Glucose-Capillary: 128 mg/dL — ABNORMAL HIGH (ref 70–99)
Glucose-Capillary: 278 mg/dL — ABNORMAL HIGH (ref 70–99)
Glucose-Capillary: 288 mg/dL — ABNORMAL HIGH (ref 70–99)
Glucose-Capillary: 243 mg/dL — ABNORMAL HIGH (ref 70–99)
Glucose-Capillary: 252 mg/dL — ABNORMAL HIGH (ref 70–99)

## 2018-04-30 LAB — I-STAT TROPONIN, ED: Troponin i, poc: 0.03 ng/mL (ref 0.00–0.08)

## 2018-04-30 LAB — MRSA PCR SCREENING: MRSA by PCR: NEGATIVE

## 2018-04-30 LAB — CBC
HEMATOCRIT: 34.8 % — AB (ref 39.0–52.0)
Hemoglobin: 11.7 g/dL — ABNORMAL LOW (ref 13.0–17.0)
MCH: 27.3 pg (ref 26.0–34.0)
MCHC: 33.6 g/dL (ref 30.0–36.0)
MCV: 81.1 fL (ref 78.0–100.0)
PLATELETS: 150 10*3/uL (ref 150–400)
RBC: 4.29 MIL/uL (ref 4.22–5.81)
RDW: 13.7 % (ref 11.5–15.5)
WBC: 7.9 10*3/uL (ref 4.0–10.5)

## 2018-04-30 LAB — CBG MONITORING, ED
GLUCOSE-CAPILLARY: 458 mg/dL — AB (ref 70–99)
GLUCOSE-CAPILLARY: 275 mg/dL — AB (ref 70–99)
Glucose-Capillary: >600 mg/dL (ref 70–99)
Glucose-Capillary: >600 mg/dL (ref 70–99)

## 2018-04-30 LAB — BRAIN NATRIURETIC PEPTIDE: B Natriuretic Peptide: 44.6 pg/mL (ref 0.0–100.0)

## 2018-04-30 LAB — CREATININE, URINE, RANDOM: CREATININE, URINE: 50.9 mg/dL

## 2018-04-30 LAB — MAGNESIUM: Magnesium: 2.8 mg/dL — ABNORMAL HIGH (ref 1.7–2.4)

## 2018-04-30 LAB — POTASSIUM: Potassium: 3.8 mmol/L (ref 3.5–5.1)

## 2018-04-30 MED ORDER — MYCOPHENOLATE MOFETIL 250 MG PO CAPS
500.0000 mg | ORAL_CAPSULE | Freq: Two times a day (BID) | ORAL | Status: DC
  Administered 2018-04-30 – 2018-05-01 (×3): 500 mg via ORAL
  Filled 2018-04-30 (×7): qty 2

## 2018-04-30 MED ORDER — ONDANSETRON HCL 4 MG/2ML IJ SOLN: 4.0000 mg | Freq: Four times a day (QID) | INTRAMUSCULAR | Status: DC | PRN

## 2018-04-30 MED ORDER — ZOLPIDEM TARTRATE 5 MG PO TABS: 5.0000 mg | ORAL_TABLET | Freq: Every evening | ORAL | Status: DC | PRN

## 2018-04-30 MED ORDER — OMEGA-3-ACID ETHYL ESTERS 1 G PO CAPS
2.0000 g | ORAL_CAPSULE | Freq: Two times a day (BID) | ORAL | Status: DC
  Administered 2018-04-30 – 2018-05-01 (×3): 2 g via ORAL
  Filled 2018-04-30 (×3): qty 2

## 2018-04-30 MED ORDER — ADULT MULTIVITAMIN W/MINERALS CH
1.0000 | ORAL_TABLET | Freq: Every day | ORAL | Status: DC
  Administered 2018-04-30 – 2018-05-01 (×2): 1 via ORAL
  Filled 2018-04-30 (×2): qty 1

## 2018-04-30 MED ORDER — POTASSIUM CHLORIDE CRYS ER 20 MEQ PO TBCR
40.0000 meq | EXTENDED_RELEASE_TABLET | Freq: Once | ORAL | Status: AC
  Administered 2018-04-30: 40 meq via ORAL
  Filled 2018-04-30: qty 2

## 2018-04-30 MED ORDER — INSULIN ASPART 100 UNIT/ML ~~LOC~~ SOLN
0.0000 [IU] | Freq: Three times a day (TID) | SUBCUTANEOUS | Status: DC
  Administered 2018-04-30: 8 [IU] via SUBCUTANEOUS
  Administered 2018-05-01: 5 [IU] via SUBCUTANEOUS
  Administered 2018-05-01: 15 [IU] via SUBCUTANEOUS
  Administered 2018-05-01: 8 [IU] via SUBCUTANEOUS

## 2018-04-30 MED ORDER — DEXTROSE-NACL 5-0.45 % IV SOLN: INTRAVENOUS | Status: DC

## 2018-04-30 MED ORDER — HYDRALAZINE HCL 20 MG/ML IJ SOLN: 5.0000 mg | INTRAMUSCULAR | Status: DC | PRN

## 2018-04-30 MED ORDER — CARVEDILOL 12.5 MG PO TABS
12.5000 mg | ORAL_TABLET | Freq: Two times a day (BID) | ORAL | Status: DC
  Administered 2018-04-30 – 2018-05-01 (×4): 12.5 mg via ORAL
  Filled 2018-04-30 (×4): qty 1

## 2018-04-30 MED ORDER — INSULIN GLARGINE 100 UNIT/ML ~~LOC~~ SOLN
20.0000 [IU] | Freq: Every day | SUBCUTANEOUS | Status: DC
  Administered 2018-04-30 – 2018-05-01 (×2): 20 [IU] via SUBCUTANEOUS
  Filled 2018-04-30 (×2): qty 0.2

## 2018-04-30 MED ORDER — SODIUM CHLORIDE 0.9 % IV SOLN
INTRAVENOUS | Status: DC
  Administered 2018-04-30: 02:00:00 via INTRAVENOUS

## 2018-04-30 MED ORDER — ACETAMINOPHEN 325 MG PO TABS: 650.0000 mg | ORAL_TABLET | Freq: Four times a day (QID) | ORAL | Status: DC | PRN

## 2018-04-30 MED ORDER — INSULIN ASPART 100 UNIT/ML ~~LOC~~ SOLN: 0.0000 [IU] | Freq: Every day | SUBCUTANEOUS | Status: DC

## 2018-04-30 MED ORDER — SODIUM CHLORIDE 0.9 % IV SOLN: INTRAVENOUS | Status: DC

## 2018-04-30 MED ORDER — INSULIN ASPART 100 UNIT/ML ~~LOC~~ SOLN: 0.0000 [IU] | Freq: Three times a day (TID) | SUBCUTANEOUS | Status: DC

## 2018-04-30 MED ORDER — CALCIUM CARBONATE-VITAMIN D 500-200 MG-UNIT PO TABS
1.0000 | ORAL_TABLET | Freq: Every day | ORAL | Status: DC
  Administered 2018-04-30 – 2018-05-01 (×2): 1 via ORAL
  Filled 2018-04-30 (×2): qty 1

## 2018-04-30 MED ORDER — CALCITRIOL 0.25 MCG PO CAPS
0.2500 ug | ORAL_CAPSULE | Freq: Every day | ORAL | Status: DC
  Administered 2018-04-30 – 2018-05-01 (×2): 0.25 ug via ORAL
  Filled 2018-04-30 (×3): qty 1

## 2018-04-30 MED ORDER — ATORVASTATIN CALCIUM 40 MG PO TABS
40.0000 mg | ORAL_TABLET | Freq: Every evening | ORAL | Status: DC
  Administered 2018-04-30 – 2018-05-01 (×2): 40 mg via ORAL
  Filled 2018-04-30 (×2): qty 1

## 2018-04-30 MED ORDER — SENNOSIDES-DOCUSATE SODIUM 8.6-50 MG PO TABS: 1.0000 | ORAL_TABLET | Freq: Two times a day (BID) | ORAL | Status: DC | PRN

## 2018-04-30 MED ORDER — POTASSIUM CHLORIDE CRYS ER 20 MEQ PO TBCR
20.0000 meq | EXTENDED_RELEASE_TABLET | Freq: Once | ORAL | Status: AC
  Administered 2018-04-30: 20 meq via ORAL
  Filled 2018-04-30: qty 1

## 2018-04-30 MED ORDER — POTASSIUM CHLORIDE 10 MEQ/100ML IV SOLN
10.0000 meq | INTRAVENOUS | Status: AC
  Administered 2018-04-30 (×4): 10 meq via INTRAVENOUS
  Filled 2018-04-30 (×4): qty 100

## 2018-04-30 MED ORDER — HEPARIN SODIUM (PORCINE) 5000 UNIT/ML IJ SOLN
5000.0000 [IU] | Freq: Three times a day (TID) | INTRAMUSCULAR | Status: DC
  Administered 2018-04-30 (×2): 5000 [IU] via SUBCUTANEOUS
  Filled 2018-04-30 (×3): qty 1

## 2018-04-30 MED ORDER — ASPIRIN EC 81 MG PO TBEC
81.0000 mg | DELAYED_RELEASE_TABLET | Freq: Every day | ORAL | Status: DC
  Administered 2018-04-30 – 2018-05-01 (×2): 81 mg via ORAL
  Filled 2018-04-30 (×2): qty 1

## 2018-04-30 MED ORDER — ONDANSETRON HCL 4 MG PO TABS: 4.0000 mg | ORAL_TABLET | Freq: Four times a day (QID) | ORAL | Status: DC | PRN

## 2018-04-30 MED ORDER — MAGNESIUM OXIDE 400 (241.3 MG) MG PO TABS
400.0000 mg | ORAL_TABLET | Freq: Every evening | ORAL | Status: DC
  Administered 2018-04-30 – 2018-05-01 (×2): 400 mg via ORAL
  Filled 2018-04-30 (×2): qty 1

## 2018-04-30 MED ORDER — INSULIN ASPART 100 UNIT/ML ~~LOC~~ SOLN
0.0000 [IU] | Freq: Every day | SUBCUTANEOUS | Status: DC
  Administered 2018-04-30: 4 [IU] via SUBCUTANEOUS

## 2018-04-30 MED ORDER — SIROLIMUS 1 MG PO TABS
1.0000 mg | ORAL_TABLET | ORAL | Status: DC
  Administered 2018-04-30: 1 mg via ORAL
  Filled 2018-04-30: qty 1

## 2018-04-30 MED ORDER — ALLOPURINOL 300 MG PO TABS
300.0000 mg | ORAL_TABLET | Freq: Every day | ORAL | Status: DC
  Administered 2018-04-30 – 2018-05-01 (×2): 300 mg via ORAL
  Filled 2018-04-30 (×2): qty 1

## 2018-04-30 MED ORDER — ACETAMINOPHEN 650 MG RE SUPP: 650.0000 mg | Freq: Four times a day (QID) | RECTAL | Status: DC | PRN

## 2018-04-30 MED ORDER — LIVING WELL WITH DIABETES BOOK
Freq: Once | Status: AC
  Administered 2018-04-30: 15:00:00
  Filled 2018-04-30: qty 1

## 2018-04-30 MED ORDER — DEXTROSE-NACL 5-0.45 % IV SOLN
INTRAVENOUS | Status: DC
  Administered 2018-04-30: 08:00:00 via INTRAVENOUS

## 2018-04-30 MED ORDER — HYDRALAZINE HCL 50 MG PO TABS
50.0000 mg | ORAL_TABLET | Freq: Two times a day (BID) | ORAL | Status: DC
  Administered 2018-04-30 – 2018-05-01 (×3): 50 mg via ORAL
  Filled 2018-04-30 (×3): qty 1

## 2018-04-30 NOTE — Progress Notes (Signed)
Pt refusing CPAP for the night. RT will continue to monitor as needed.  

## 2018-04-30 NOTE — H&P (Signed)
History and Physical    Brian Suarez OHY:[id number] DOB: [birth year] DOA: 04/29/2018  Referring MD/NP/PA:   PCP: Jani Gravel, MD   Patient coming from:  The patient is coming from home.  At baseline, pt is independent for most of ADL.   Chief Complaint: generalized weakness, poor appetite, increased urinary frequency  HPI: Brian Suarez is a 72 y.o. male with medical history significant of hypertension, hyperlipidemia, prediabetes, gout, heart transplantation 2002 (following up in [place]), OSA on CPAP, CKD-4, who presents with generalized weakness, poor appetite and increased urinary frequency.  Pt states that he has been feeling generalized weak in the past several days, which has been progressively getting worse. He has poor appetite and decreased oral intake. He also has increased urinary frequency, but no dysuria or burning on urination. Patient was seen and evaluated by his primary doctor today. Found to have hyperglycemic. He followed up at urgent care for a fingerstick which was greater than 600. Regarding his history of heart transplantation, patient states that he is taking CellCept and Sirolimus. She does not have any chest pain or SOB. No cough, fever or chills. Patient denies nausea, vomiting, diarrhea or abdominal pain.no unilateral weakness.  ED Course: pt was found to have blood sugar 1013, bicarbonate 26, slightly worsening renal function, AG16, no ketone in urinalysis, urinalysis negative for UTI, troponin negative, WBC 8.3, temperature normal, no tachycardia, oxygen sats 97% on room air, negative chest x-ray. Patient is placed on stepdown for observation.  Review of Systems:   General: no fevers, chills, no body weight gain, has poor appetite, has fatigue HEENT: no blurry vision, hearing changes or sore throat Respiratory: no dyspnea, coughing, wheezing CV: no chest pain, no palpitations GI: no nausea, vomiting, abdominal pain, diarrhea, constipation GU: no dysuria, burning  on urination, has increased urinary frequency, no hematuria  Ext: no leg edema Neuro: no unilateral weakness, numbness, or tingling, no vision change or hearing loss Skin: no rash, no skin tear. MSK: No muscle spasm, no deformity, no limitation of range of movement in spin Heme: No easy bruising.  Travel history: No recent long distant travel.  Allergy:  Allergies  Allergen Reactions  . Rapamune [Sirolimus] Other (See Comments)    Pt states that this medication causes headaches.   . Verapamil Other (See Comments)    Reaction: Rash and ulcers    Past Medical History:  Diagnosis Date  . Chronic renal insufficiency   . Gout   . Heart transplanted ([place])   . Hypercholesteremia   . Hypertension   . OSA (obstructive sleep apnea)     Past Surgical History:  Procedure Laterality Date  . CARDIAC SURGERY  2005   heart cath  . CYSTECTOMY     EPIDERMOID  . ENDOMYOCARDIAL BIOPSY  2005  . HEART TRANSPLANT  2002  . HERNIA REPAIR    . HERNIA REPAIR    . PERICARDIOCENTESIS  2010  . PERICARDIUM SURGERY  2002   BX     Social History:  reports that he has never smoked. He has never used smokeless tobacco. He reports that he does not drink alcohol or use drugs.  Family History:  Family History  Problem Relation Age of Onset  . Dementia Mother   . Other Mother        HYPOTENSTION  . Throat cancer Father      Prior to Admission medications   Medication Sig Start Date End Date Taking? Authorizing Provider  allopurinol (ZYLOPRIM) 300 MG tablet  Take 300 mg by mouth daily.   Yes [provider]  aspirin EC 81 MG tablet Take 81 mg by mouth daily.   Yes [provider]  atorvastatin (LIPITOR) 40 MG tablet Take 40 mg by mouth every evening.    Yes [provider]  calcitRIOL (ROCALTROL) 0.25 MCG capsule Take 0.25 mcg by mouth daily.   Yes [provider]  calcium-vitamin D (OSCAL WITH D) 500-200 MG-UNIT per tablet Take 1 tablet by mouth daily.   Yes  [provider]  carvedilol (COREG) 12.5 MG tablet Take 12.5 mg by mouth 2 (two) times daily with a meal.   Yes [provider]  fish oil-omega-3 fatty acids 1000 MG capsule Take 2 g by mouth 2 (two) times daily.    Yes [provider]  furosemide (LASIX) 40 MG tablet Take 40-80 mg by mouth 2 (two) times daily.    Yes [provider]  hydrALAZINE (APRESOLINE) 50 MG tablet Take 50 mg by mouth 2 (two) times daily.   Yes [provider]  magnesium oxide (MAG-OX) 400 MG tablet Take 400 mg by mouth every evening.    Yes [provider]  Multiple Vitamin (MULTIVITAMIN WITH MINERALS) TABS Take 1 tablet by mouth daily.   Yes [provider]  mycophenolate (CELLCEPT) 500 MG tablet Take 500 mg by mouth 2 (two) times daily.    Yes [provider]  sennosides-docusate sodium (SENOKOT-S) 8.6-50 MG tablet Take 1-2 tablets by mouth 2 (two) times daily as needed for constipation.    Yes [provider]  sirolimus (RAPAMUNE) 1 MG tablet Take 1 tablet by mouth every other day.  07/23/17 07/23/18 Yes [provider]    Physical Exam: Vitals:   04/30/18 0230 04/30/18 0330 04/30/18 0400 04/30/18 0500  BP: 138/88 110/74 118/79   Pulse: 77 76 78   Resp: 11 12 12    Temp:   98.2 F (36.8 C)   TempSrc:   Oral   SpO2: 93% 95% 95%   Weight:    93 kg  Height:    6\' 1"  (1.854 m)   General: Not in acute distress. dry mucus and membrane HEENT:       Eyes: PERRL, EOMI, no scleral icterus.       ENT: No discharge from the ears and nose, no pharynx injection, no tonsillar enlargement.        Neck: No JVD, no bruit, no mass felt. Heme: No neck lymph node enlargement. Cardiac: S1/S2, RRR, No murmurs, No gallops or rubs. Respiratory:  No rales, wheezing, rhonchi or rubs. GI: Soft, nondistended, nontender, no rebound pain, no organomegaly, BS present. GU: No hematuria Ext: No pitting leg edema bilaterally. 2+DP/PT pulse  bilaterally. Musculoskeletal: No joint deformities, No joint redness or warmth, no limitation of ROM in spin. Skin: No rashes.  Neuro: Alert, oriented X3, cranial nerves II-XII grossly intact, moves all extremities normally.  Psych: Patient is not psychotic, no suicidal or hemocidal ideation.  Labs on Admission: I have personally reviewed following labs and imaging studies  CBC: Recent Labs  Lab 04/29/18 2117 04/30/18 0348  WBC 8.3 7.9  HGB 13.0 11.7*  HCT 40.2 34.8*  MCV 85.5 81.1  PLT 156 188   Basic Metabolic Panel: Recent Labs  Lab 04/29/18 2117 04/30/18 0348  NA 120* 132*  K 4.1 2.8*  CL 78* 92*  CO2 26 24  GLUCOSE 1,013* 350*  BUN 102* 96*  CREATININE 4.71* 4.45*  CALCIUM 10.1 9.3  GFR: Estimated Creatinine Clearance: 17 mL/min (A) (by C-G formula based on SCr of 4.45 mg/dL (H)). Liver Function Tests: No results for input(s): AST, ALT, ALKPHOS, BILITOT, PROT, ALBUMIN in the last 168 hours. No results for input(s): LIPASE, AMYLASE in the last 168 hours. No results for input(s): AMMONIA in the last 168 hours. Coagulation Profile: No results for input(s): INR, PROTIME in the last 168 hours. Cardiac Enzymes: No results for input(s): CKTOTAL, CKMB, CKMBINDEX, TROPONINI in the last 168 hours. BNP (last 3 results) No results for input(s): PROBNP in the last 8760 hours. HbA1C: No results for input(s): HGBA1C in the last 72 hours. CBG: Recent Labs  Lab 04/30/18 0047 04/30/18 0152 04/30/18 0303 04/30/18 0409 04/30/18 0505  GLUCAP >600* >600* 458* 275* 220*   Lipid Profile: No results for input(s): CHOL, HDL, LDLCALC, TRIG, CHOLHDL, LDLDIRECT in the last 72 hours. Thyroid Function Tests: No results for input(s): TSH, T4TOTAL, FREET4, T3FREE, THYROIDAB in the last 72 hours. Anemia Panel: No results for input(s): VITAMINB12, FOLATE, FERRITIN, TIBC, IRON, RETICCTPCT in the last 72 hours. Urine analysis:    Component Value Date/Time   COLORURINE YELLOW  04/29/2018 2115   APPEARANCEUR CLEAR 04/29/2018 2115   LABSPEC 1.015 04/29/2018 2115   PHURINE 5.0 04/29/2018 2115   GLUCOSEU >=500 (A) 04/29/2018 2115   HGBUR SMALL (A) 04/29/2018 2115   BILIRUBINUR NEGATIVE 04/29/2018 2115   KETONESUR NEGATIVE 04/29/2018 2115   PROTEINUR 30 (A) 04/29/2018 2115   UROBILINOGEN 0.2 02/23/2012 1804   NITRITE NEGATIVE 04/29/2018 2115   LEUKOCYTESUR NEGATIVE 04/29/2018 2115   Sepsis Labs: @LABRCNTIP (procalcitonin:4,lacticidven:4) )No results found for this or any previous visit (from the past 240 hour(s)).   Radiological Exams on Admission: Dg Chest Portable 1 View  Result Date: 04/30/2018 CLINICAL DATA:  Dyspnea, heart transplant 2002. EXAM: PORTABLE CHEST 1 VIEW COMPARISON:  11/20/2003 FINDINGS: Top-normal cardiac size. Mild aortic atherosclerosis at the arch without aneurysm. No pulmonary consolidation or CHF. No effusion or pneumothorax. Minimal basilar atelectasis on the left. No acute osseous abnormality. IMPRESSION: No active disease. Mild aortic atherosclerosis. Electronically Signed   By: Ashley Royalty M.D.   On: 04/30/2018 00:20     EKG: Independently reviewed. Sinus rhythm, QTC 469, RAD, low voltage, poor R-wave progression, nonspecific T-wave change.  Assessment/Plan Principal Problem:   Type 2 diabetes mellitus with hyperosmolar nonketotic hyperglycemia (HCC) Active Problems:   OSA (obstructive sleep apnea)   Heart transplant recipient [facility])   Acute renal failure superimposed on stage 4 chronic kidney disease ([place])   Essential hypertension   Gout   Prediabetes   Type 2 diabetes mellitus with hyperosmolar nonketotic hyperglycemia and hx of prediabetes: pt is not taking diabetic medications at home currently. blood sugar 1013, bicarbonate 26, AG16, but no ketone in urinalysis, no DKA. Patient's mental status normal.  -will place in SDU for obs -IV insulin gtt started in ED -CBG q1h -IVF: 2L NS bolus, then 50 cc/h (pt has CKD-IV,  limiting aggressive IV fluid) -check A1c -consult diabetic educator.  OSA (obstructive sleep apnea): -CPAP  Heart transplant recipient [facility]): Cardiac: 11/12/16 showed EF 59%. No shortness of breath or chest pain. Troponin negative. -continue cellcept and sirolimus -check sirolimus level -hold Lasix -Check BNP level - on ASA  AoCKD-IV: slightly worsening than baseline. Baseline Cre is~4.0 , pt's Cre is 4.71 and BUN 102 on admission. Likely due to prerenal secondary to dehydration and continuation of diuretics. Pt is following up with Dr. Joelyn Oms - IVF as above - Follow up  renal function by BMP - Check FeUrea - Hold lasix  Essential hypertension: -IV hydralazine when necessary Continue home Coreg, hydralazine,  Gout: -continue home allopurinol  HLD: -lipitor   DVT ppx: SQ Heparin  Code Status: Full code Family Communication: None at bed side.   Disposition Plan:  Anticipate discharge back to previous home environment Consults called: none  Admission status:  SDU/obs     Date of Service 04/30/2018    Ivor Costa [facility] Pager [phone]  If 7PM-7AM, please contact night-coverage [email or web address] Password [facility] 04/30/2018, 6:38 AM

## 2018-04-30 NOTE — ED Notes (Signed)
Admitting at bedside 

## 2018-04-30 NOTE — Discharge Instructions (Signed)
To ER.  Wife agrees to take him by private vehicle.  I feel like this is safe.

## 2018-04-30 NOTE — Progress Notes (Signed)
[facility] notified that D5-1/2NS has not been ordered per protocol for Glucostablizer.

## 2018-04-30 NOTE — Progress Notes (Signed)
Potassium 2.8 reported to [facility]. Waiting for reply.

## 2018-04-30 NOTE — Care Management (Signed)
04-30-18 BENEFITS CHECK :  # 8.  S/W   MO  @ [employer] # [phone]  1.  LANTUS PEN COVER- YES CO-PAY- $ 35.00 TIER- NO PRIOR APPROVAL- NO  2. NOVOLOG PEN COVER- NONE FORMULARY PRIOR APPROVAL- YES # [phone] FOE EXCEPTION  PREFERRED PHARMACY : YES    [facility]

## 2018-04-30 NOTE — Progress Notes (Signed)
NCM received consult : Pt prefers to go home on insulin pens (Lantus and Novolog). Are these covered with his insurance and what is copay? Benefits check in process.Marland KitchenMarland KitchenNCM to f/u with results. Whitman Hero RN,BSN,CM [phone]

## 2018-04-30 NOTE — Progress Notes (Signed)
Inpatient Diabetes Program Recommendations  AACE/ADA: New Consensus Statement on Inpatient Glycemic Control (2015)  Target Ranges:  Prepandial:   less than 140 mg/dL      Peak postprandial:   less than 180 mg/dL (1-2 hours)      Critically ill patients:  140 - 180 mg/dL   Lab Results  Component Value Date   GLUCAP 137 (H) 04/30/2018    Review of Glycemic Control  Diabetes history: Pre-diabetes Outpatient Diabetes medications: No meds for diabetes Current orders for Inpatient glycemic control: IV insulin  HgbA1C pending. AG - 16.   Inpatient Diabetes Program Recommendations:     Lantus 15 units QHS Novolog 0-9 units tidwc and hs + 3 units tidwc  Will order Living Well With Diabetes book and videos. Pt prefers insulin pen to syringe. Will ask care manager to check insurance coverage for insulin pen.  Pt states he eats healthy and is willing to check his blood sugars and start on insulin. Has glucose meter at home. Awaiting HgbA1C. Will teach insulin pen administration on 9/5 am.  Discussed above with RN.  Thank you. Lorenda Peck, RD, LDN, CDE Inpatient Diabetes Coordinator [phone]

## 2018-04-30 NOTE — Progress Notes (Signed)
@IPLOG @        PROGRESS NOTE                                                                                                                                                                                                             Patient Demographics:    Brian Suarez, is a 72 y.o. male, DOB - [birth year], QHU:[id number]  Admit date - 04/29/2018   Admitting Physician Ivor Costa, MD  Outpatient Primary MD for the patient is Jani Gravel, MD  LOS - 0  Chief Complaint  Patient presents with  . Hyperglycemia       Brief Narrative  Brian Suarez is a 72 y.o. male with medical history significant of hypertension, hyperlipidemia, prediabetes, gout, heart transplantation 2002 (following up in [place]), OSA on CPAP, CKD-4, who presents with generalized weakness, poor appetite and increased urinary frequency, and the ER work-up suggested that he was in nonketotic hyperosmolar state with a blood sugar of over 1000.  Was admitted to the hospital for further work-up and care.   Subjective:    Brian Suarez today has, No headache, No chest pain, No abdominal pain - No Nausea, No new weakness tingling or numbness, No Cough - SOB.     Assessment  & Plan :     1.  Nonketotic hyperosmolar state due to newly diagnosed DM type II.  Has been adequately hydrated, currently on insulin drip, placed him on Lantus along with sliding scale, this is not DKA, patient will receive diabetes and insulin education prior to discharge.  Will monitor CBGs and A1c.  No results found for: HGBA1C  CBG (last 3)  Recent Labs    04/30/18 0647 04/30/18 0752 04/30/18 0904  GLUCAP 128* 108* 137*     2.  HX of heart transplant.  This was done in [place].  Echocardiogram in March 2018 showed a preserved EF of around 60%.  Currently no acute issues, chest pain-free, continue CellCept along with Sirolimus, hold Lasix for now.  3.  ARF on CKD 5.  Baseline creatinine is close to 4, chart reviewed from [facility], patient follows with  Dr. Joelyn Oms, for now hydrate and monitor.  4.  Essential hypertension.  Continue Coreg and hydralazine orally along with as needed as needed IV hydralazine.  5.  History of gout.  On allopurinol and pain-free.  6.  OSA.  Nighttime CPAP.  7.  Dyslipidemia.  Continue home dose statin.    Family Communication  :  None  Code Status :  Full  Disposition Plan  :  Home 1-2 days  Consults  :  DM & Insulin education  Procedures  :    DVT Prophylaxis  :   Heparin    Lab Results  Component Value Date   PLT 150 04/30/2018    Diet :  Diet Order            Diet Carb Modified Fluid consistency: Thin; Room service appropriate? Yes  Diet effective now               Inpatient Medications Scheduled Meds: . allopurinol  300 mg Oral Daily  . aspirin EC  81 mg Oral Daily  . atorvastatin  40 mg Oral QPM  . calcitRIOL  0.25 mcg Oral Daily  . calcium-vitamin D  1 tablet Oral Q breakfast  . carvedilol  12.5 mg Oral BID WC  . heparin  5,000 Units Subcutaneous Q8H  . hydrALAZINE  50 mg Oral BID  . insulin aspart  0-5 Units Subcutaneous QHS  . insulin aspart  0-9 Units Subcutaneous TID WC  . insulin glargine  20 Units Subcutaneous Daily  . magnesium oxide  400 mg Oral QPM  . multivitamin with minerals  1 tablet Oral Daily  . mycophenolate  500 mg Oral BID  . omega-3 acid ethyl esters  2 g Oral BID  . sirolimus  1 mg Oral QODAY   Continuous Infusions: . dextrose 5 % and 0.45% NaCl 75 mL/hr at 04/30/18 1044  . insulin (NOVOLIN-R) infusion 3.8 Units/hr (04/30/18 1020)  . potassium chloride 10 mEq (04/30/18 1021)   PRN Meds:.acetaminophen **OR** [DISCONTINUED] acetaminophen, hydrALAZINE, [DISCONTINUED] ondansetron **OR** ondansetron (ZOFRAN) IV, senna-docusate, zolpidem  Antibiotics  :   Anti-infectives (From admission, onward)   None          Objective:   Vitals:   04/30/18 0400 04/30/18 0500 04/30/18 0758 04/30/18 1017  BP: 118/79  134/75 121/69  Pulse: 78  74   Resp:  12     Temp: 98.2 F (36.8 C)     TempSrc: Oral     SpO2: 95%     Weight:  93 kg    Height:  6\' 1"  (1.854 m)      Wt Readings from Last 3 Encounters:  04/30/18 93 kg  04/29/18 91.6 kg  09/26/17 100.9 kg     Intake/Output Summary (Last 24 hours) at 04/30/2018 1055 Last data filed at 04/30/2018 0043 Gross per 24 hour  Intake 2000 ml  Output -  Net 2000 ml     Physical Exam  Awake Alert, Oriented X 3, No new F.N deficits, Normal affect [place].AT,PERRAL Supple Neck,No JVD, No cervical lymphadenopathy appriciated.  Symmetrical Chest wall movement, Good air movement bilaterally, CTAB RRR,No Gallops,Rubs or new Murmurs, No Parasternal Heave +ve B.Sounds, Abd Soft, No tenderness, No organomegaly appriciated, No rebound - guarding or rigidity. No Cyanosis, Clubbing or edema, No new Rash or bruise     Data Review:    CBC Recent Labs  Lab 04/29/18 2117 04/30/18 0348  WBC 8.3 7.9  HGB 13.0 11.7*  HCT 40.2 34.8*  PLT 156 150  MCV 85.5 81.1  MCH 27.7 27.3  MCHC 32.3 33.6  RDW 14.2 13.7    Chemistries  Recent Labs  Lab 04/29/18 2117 04/30/18 0348  NA 120* 132*  K 4.1 2.8*  CL 78* 92*  CO2 26 24  GLUCOSE 1,013* 350*  BUN 102* 96*  CREATININE 4.71* 4.45*  CALCIUM 10.1 9.3  MG  --  2.8*   ------------------------------------------------------------------------------------------------------------------ No results for input(s): CHOL, HDL, LDLCALC, TRIG, CHOLHDL, LDLDIRECT in the last 72 hours.  No results found for: HGBA1C ------------------------------------------------------------------------------------------------------------------ No results for input(s): TSH, T4TOTAL, T3FREE, THYROIDAB in the last 72 hours.  Invalid input(s): FREET3 ------------------------------------------------------------------------------------------------------------------ No results for input(s): VITAMINB12, FOLATE, FERRITIN, TIBC, IRON, RETICCTPCT in the last 72 hours.  Coagulation  profile No results for input(s): INR, PROTIME in the last 168 hours.  No results for input(s): DDIMER in the last 72 hours.  Cardiac Enzymes No results for input(s): CKMB, TROPONINI, MYOGLOBIN in the last 168 hours.  Invalid input(s): CK ------------------------------------------------------------------------------------------------------------------    Component Value Date/Time   BNP 44.6 04/30/2018 0348    Micro Results Recent Results (from the past 240 hour(s))  MRSA PCR Screening     Status: None   Collection Time: 04/30/18  5:13 AM  Result Value Ref Range Status   MRSA by PCR NEGATIVE NEGATIVE Final    Comment:        The GeneXpert MRSA Assay (FDA approved for NASAL specimens only), is one component of a comprehensive MRSA colonization surveillance program. It is not intended to diagnose MRSA infection nor to guide or monitor treatment for MRSA infections. Performed at [facility], [place] [address], [place], [place] [address]     Radiology Reports Dg Chest Portable 1 View  Result Date: 04/30/2018 CLINICAL DATA:  Dyspnea, heart transplant 2002. EXAM: PORTABLE CHEST 1 VIEW COMPARISON:  11/20/2003 FINDINGS: Top-normal cardiac size. Mild aortic atherosclerosis at the arch without aneurysm. No pulmonary consolidation or CHF. No effusion or pneumothorax. Minimal basilar atelectasis on the left. No acute osseous abnormality. IMPRESSION: No active disease. Mild aortic atherosclerosis. Electronically Signed   By: Ashley Royalty M.D.   On: 04/30/2018 00:20    Time Spent in minutes  30   Lala Lund M.D on 04/30/2018 at 10:55 AM  To page go to [email or web address] - password [facility]

## 2018-05-01 DIAGNOSIS — N179 Acute kidney failure, unspecified: Secondary | ICD-10-CM

## 2018-05-01 DIAGNOSIS — E1100 Type 2 diabetes mellitus with hyperosmolarity without nonketotic hyperglycemic-hyperosmolar coma (NKHHC): Secondary | ICD-10-CM

## 2018-05-01 DIAGNOSIS — N184 Chronic kidney disease, stage 4 (severe): Secondary | ICD-10-CM | POA: Diagnosis not present

## 2018-05-01 LAB — CBC
HCT: 34.7 % — ABNORMAL LOW (ref 39.0–52.0)
HEMOGLOBIN: 11.6 g/dL — AB (ref 13.0–17.0)
MCH: 27.6 pg (ref 26.0–34.0)
MCHC: 33.4 g/dL (ref 30.0–36.0)
MCV: 82.4 fL (ref 78.0–100.0)
PLATELETS: 117 10*3/uL — AB (ref 150–400)
RBC: 4.21 MIL/uL — AB (ref 4.22–5.81)
RDW: 13.9 % (ref 11.5–15.5)
WBC: 6.8 10*3/uL (ref 4.0–10.5)

## 2018-05-01 LAB — GLUCOSE, CAPILLARY
GLUCOSE-CAPILLARY: 398 mg/dL — AB (ref 70–99)
GLUCOSE-CAPILLARY: 233 mg/dL — AB (ref 70–99)
Glucose-Capillary: 288 mg/dL — ABNORMAL HIGH (ref 70–99)

## 2018-05-01 LAB — BASIC METABOLIC PANEL
ANION GAP: 13 (ref 5–15)
BUN: 89 mg/dL — ABNORMAL HIGH (ref 8–23)
CALCIUM: 9.2 mg/dL (ref 8.9–10.3)
CO2: 21 mmol/L — ABNORMAL LOW (ref 22–32)
CREATININE: 4.01 mg/dL — AB (ref 0.61–1.24)
Chloride: 99 mmol/L (ref 98–111)
GFR calc non Af Amer: 14 mL/min — ABNORMAL LOW (ref >60)
GFR, EST AFRICAN AMERICAN: 16 mL/min — AB (ref >60)
Glucose, Bld: 296 mg/dL — ABNORMAL HIGH (ref 70–99)
Potassium: 3.7 mmol/L (ref 3.5–5.1)
SODIUM: 133 mmol/L — AB (ref 135–145)

## 2018-05-01 LAB — MAGNESIUM: MAGNESIUM: 2.7 mg/dL — AB (ref 1.7–2.4)

## 2018-05-01 LAB — HEMOGLOBIN A1C
Hgb A1c MFr Bld: 12.5 % — ABNORMAL HIGH (ref 4.8–5.6)
Mean Plasma Glucose: 312 mg/dL

## 2018-05-01 LAB — UREA NITROGEN, URINE: UREA NITROGEN UR: 394 mg/dL

## 2018-05-01 MED ORDER — INSULIN GLARGINE 100 UNIT/ML ~~LOC~~ SOLN: 30.0000 [IU] | Freq: Every day | SUBCUTANEOUS | Status: DC

## 2018-05-01 MED ORDER — INSULIN LISPRO 100 UNIT/ML (KWIKPEN): 4.0000 [IU] | PEN_INJECTOR | Freq: Three times a day (TID) | SUBCUTANEOUS | 0 refills | Status: DC

## 2018-05-01 MED ORDER — INSULIN GLARGINE 100 UNIT/ML SOLOSTAR PEN: 24.0000 [IU] | PEN_INJECTOR | Freq: Every morning | SUBCUTANEOUS | Status: DC

## 2018-05-01 MED ORDER — INSULIN GLARGINE 100 UNIT/ML SOLOSTAR PEN: 30.0000 [IU] | PEN_INJECTOR | Freq: Every morning | SUBCUTANEOUS | Status: AC

## 2018-05-01 MED ORDER — INSULIN GLARGINE 100 UNIT/ML ~~LOC~~ SOLN
10.0000 [IU] | Freq: Once | SUBCUTANEOUS | Status: AC
  Administered 2018-05-01: 10 [IU] via SUBCUTANEOUS
  Filled 2018-05-01: qty 0.1

## 2018-05-01 MED ORDER — INSULIN LISPRO 100 UNIT/ML (KWIKPEN): 4.0000 [IU] | PEN_INJECTOR | Freq: Three times a day (TID) | SUBCUTANEOUS | 11 refills | Status: DC

## 2018-05-01 MED ORDER — INSULIN PEN NEEDLE 31G X 5 MM MISC: 0 refills | Status: AC

## 2018-05-01 NOTE — Progress Notes (Signed)
Inpatient Diabetes Program Recommendations  AACE/ADA: New Consensus Statement on Inpatient Glycemic Control (2015)  Target Ranges:  Prepandial:   less than 140 mg/dL      Peak postprandial:   less than 180 mg/dL (1-2 hours)      Critically ill patients:  140 - 180 mg/dL   Lab Results  Component Value Date   GLUCAP 288 (H) 05/01/2018   HGBA1C 12.5 (H) 04/29/2018    Review of Glycemic Control  CBGs since off insulin drip - 252 - 304 mg/dL.   Inpatient Diabetes Program Recommendations:    Increase Lantus to 24 units QD. Add Novolog 4 units tidwc for meal coverage insulin.  Appreciate care management checking on insurance copay for insulin pens. Will teach insulin pen administration this morning.  Order insulin pen starter kit.  Thank you. Lorenda Peck, RD, LDN, CDE Inpatient Diabetes Coordinator [phone]

## 2018-05-01 NOTE — Progress Notes (Signed)
Inpatient Diabetes Program Recommendations  AACE/ADA: New Consensus Statement on Inpatient Glycemic Control (2015)  Target Ranges:  Prepandial:   less than 140 mg/dL      Peak postprandial:   less than 180 mg/dL (1-2 hours)      Critically ill patients:  140 - 180 mg/dL   Lab Results  Component Value Date   GLUCAP 398 (H) 05/01/2018   HGBA1C 12.5 (H) 04/29/2018    Review of Glycemic Control  Educated patient on insulin pen use at home.  Reviewed all steps if insulin pen including attachment of needle, 2-unit air shot, dialing up dose, giving injection, removing needle, disposal of sharps, storage of unused insulin, disposal of insulin etc. Patient able to provide successful return demonstration. Also reviewed troubleshooting with insulin pen. MD to give patient Rxs for insulin pens and insulin pen needles.  To be discharged on Lantus 24 units QHS To f/u with PCP on 9/6.  Thank you. Lorenda Peck, RD, LDN, CDE Inpatient Diabetes Coordinator [phone]

## 2018-05-01 NOTE — Care Management CC44 (Signed)
Condition Code 44 Documentation Completed  Patient Details  Name: ELBERT SPICKLER MRN: [id number] Date of Birth: 10-18-1945   Condition Code 44 given:  Yes Patient signature on Condition Code 44 notice:  Yes Documentation of 2 MD's agreement:  Yes Code 44 added to claim:  Yes    Sharin Mons, RN 05/01/2018, 1:50 PM

## 2018-05-01 NOTE — Discharge Summary (Signed)
Physician Discharge Summary  Brian Suarez CHE:[id number] DOB: 03-18-1946 DOA: 04/29/2018  PCP: Jani Gravel, MD  Admit date: 04/29/2018 Discharge date: 05/01/2018  Recommendations for Outpatient Follow-up:   Hyperosmolar non-ketotic state.  New diagnosis diabetes mellitus. Hgb A1c 12.5 --Started on Lantus, Humalog with meals.  Titrate for optimal control.   Follow-up Information    Jani Gravel, MD Follow up on 05/02/2018.   Specialty:  Internal Medicine Why:  9:30 Contact information: [place] [address] [phone]            Discharge Diagnoses:  1. Hyperosmolar non-ketotic state.   2. New diagnosis diabetes mellitus. Hgb A1c 12.5 3. AKI on CKD stage IV 4. Essential HTN  Discharge Condition: improved Disposition: home  Diet recommendation: heart healthy, diabetic diet  Filed Weights   04/29/18 2051 04/30/18 0500  Weight: 91.6 kg 93 kg    History of present illness:  72 year old man PMH heart transplantation 2002, CKD stage IV, prediabetes, presented with generalized weakness, hyperglycemia.  Admitted for hyperosmolar non-ketotic hyperglycemic state with blood sugar of 1013.  Hospital Course:  Patient was treated with insulin infusion with gradual clinical improvement.  Hyperosmolar state resolved.  Renal function returned to baseline.  Blood sugars did remain labile and Lantus was increased.  He has close outpatient follow-up arranged with his PCP.  Hyperosmolar non-ketotic state.  New diagnosis diabetes mellitus. Hgb A1c 12.5 --CBG 200-300s. Increase Lantus to 30 units QHS --Add Humalog4 units tidwc for meal coverage insulin.  S/p heart transplant 2002 --Continue CellCept, sirolimus  AKI on CKD stage IV --appears to be back to baseline. Creatinine/BUN trending down. Baseline 3.8-4.5, last 3.8 on 11/19/2017 at [facility]. Sees Dr. Joelyn Oms.  Essential HTN --stable. Continue carvedilol, hydralazine  OSA on CPAP --refusing CPAP  here  Today's assessment: S: feels well, no complaints O: Vitals:  Vitals:   05/01/18 1045 05/01/18 1337  BP: 129/81 (!) 158/87  Pulse:  86  Resp:  16  Temp:  98.5 F (36.9 C)  SpO2:  97%    Constitutional:  . Appears calm and comfortable Respiratory:  . CTA bilaterally, no w/r/r.  . Respiratory effort normal.  Cardiovascular:  . RRR, no m/r/g . No LE extremity edema   Telemetry SR Psychiatric:  . judgement and insight appear normal . Mental status o Mood, affect appropriate  Discharge Instructions  Discharge Instructions    Diet - low sodium heart healthy   Complete by:  As directed    Diet Carb Modified   Complete by:  As directed    Discharge instructions   Complete by:  As directed    Call your physician or seek immediate medical attention for weakness, confusion, blood sugar greater than 400 or less than 70.   Increase activity slowly   Complete by:  As directed      Allergies as of 05/01/2018      Reactions   Rapamune [sirolimus] Other (See Comments)   Pt states that this medication causes headaches.    Verapamil Other (See Comments)   Reaction: Rash and ulcers      Medication List    TAKE these medications   allopurinol 300 MG tablet Commonly known as:  ZYLOPRIM Take 300 mg by mouth daily.   aspirin EC 81 MG tablet Take 81 mg by mouth daily.   atorvastatin 40 MG tablet Commonly known as:  LIPITOR Take 40 mg by mouth every evening.   calcitRIOL 0.25 MCG capsule Commonly known as:  ROCALTROL Take 0.25 mcg by mouth daily.   calcium-vitamin D 500-200 MG-UNIT tablet Commonly known as:  OSCAL WITH D Take 1 tablet by mouth daily.   carvedilol 12.5 MG tablet Commonly known as:  COREG Take 12.5 mg by mouth 2 (two) times daily with a meal.   fish oil-omega-3 fatty acids 1000 MG capsule Take 2 g by mouth 2 (two) times daily.   furosemide 40 MG tablet Commonly known as:  LASIX Take 40-80 mg by mouth 2 (two) times daily.   hydrALAZINE 50 MG  tablet Commonly known as:  APRESOLINE Take 50 mg by mouth 2 (two) times daily.   Insulin Glargine 100 UNIT/ML Solostar Pen Commonly known as:  LANTUS Inject 30 Units into the skin every morning.   insulin lispro 100 UNIT/ML KiwkPen Commonly known as:  HUMALOG Inject 0.04 mLs (4 Units total) into the skin 3 (three) times daily with meals.   Insulin Pen Needle 31G X 5 MM Misc Use as directed   magnesium oxide 400 MG tablet Commonly known as:  MAG-OX Take 400 mg by mouth every evening.   multivitamin with minerals Tabs tablet Take 1 tablet by mouth daily.   mycophenolate 500 MG tablet Commonly known as:  CELLCEPT Take 500 mg by mouth 2 (two) times daily.   sennosides-docusate sodium 8.6-50 MG tablet Commonly known as:  SENOKOT-S Take 1-2 tablets by mouth 2 (two) times daily as needed for constipation.   sirolimus 1 MG tablet Commonly known as:  RAPAMUNE Take 1 tablet by mouth every other day.      Allergies  Allergen Reactions  . Rapamune [Sirolimus] Other (See Comments)    Pt states that this medication causes headaches.   . Verapamil Other (See Comments)    Reaction: Rash and ulcers    The results of significant diagnostics from this hospitalization (including imaging, microbiology, ancillary and laboratory) are listed below for reference.    Significant Diagnostic Studies: Dg Chest Portable 1 View  Result Date: 04/30/2018 CLINICAL DATA:  Dyspnea, heart transplant 2002. EXAM: PORTABLE CHEST 1 VIEW COMPARISON:  11/20/2003 FINDINGS: Top-normal cardiac size. Mild aortic atherosclerosis at the arch without aneurysm. No pulmonary consolidation or CHF. No effusion or pneumothorax. Minimal basilar atelectasis on the left. No acute osseous abnormality. IMPRESSION: No active disease. Mild aortic atherosclerosis. Electronically Signed   By: Ashley Royalty M.D.   On: 04/30/2018 00:20    Microbiology: Recent Results (from the past 240 hour(s))  MRSA PCR Screening     Status: None    Collection Time: 04/30/18  5:13 AM  Result Value Ref Range Status   MRSA by PCR NEGATIVE NEGATIVE Final    Comment:        The GeneXpert MRSA Assay (FDA approved for NASAL specimens only), is one component of a comprehensive MRSA colonization surveillance program. It is not intended to diagnose MRSA infection nor to guide or monitor treatment for MRSA infections. Performed at [facility], [place] [address], [place], [place] [address]      Labs: Basic Metabolic Panel: Recent Labs  Lab 04/29/18 2117 04/30/18 0348 04/30/18 1130 04/30/18 1543 05/01/18 0437  NA 120* 132* 132*  --  133*  K 4.1 2.8* 3.7 3.8 3.7  CL 78* 92* 94*  --  99  CO2 26 24 26   --  21*  GLUCOSE 1,013* 350* 289*  --  296*  BUN 102* 96* 95*  --  89*  CREATININE 4.71* 4.45* 4.17*  --  4.01*  CALCIUM 10.1 9.3 9.0  --  9.2  MG  --  2.8*  --   --  2.7*   CBC: Recent Labs  Lab 04/29/18 2117 04/30/18 0348 05/01/18 0437  WBC 8.3 7.9 6.8  HGB 13.0 11.7* 11.6*  HCT 40.2 34.8* 34.7*  MCV 85.5 81.1 82.4  PLT 156 150 117*    Recent Labs    04/30/18 0348  BNP 44.6    CBG: Recent Labs  Lab 04/30/18 1348 04/30/18 1744 04/30/18 2121 05/01/18 0803 05/01/18 1301  GLUCAP 243* 252* 304* 288* 398*    Principal Problem:   Type 2 diabetes mellitus with hyperosmolar nonketotic hyperglycemia (HCC) Active Problems:   OSA (obstructive sleep apnea)   Heart transplant recipient [facility])   Acute renal failure superimposed on stage 4 chronic kidney disease ([place])   Essential hypertension   Gout   Prediabetes   HLD (hyperlipidemia)   Diabetes mellitus due to underlying condition with hyperosmolarity without nonketotic hyperglycemic-hyperosmolar coma [facility]) ([place])   Time coordinating discharge: 50 minutes  Signed:  Murray Hodgkins, MD [facility] 05/01/2018, 4:35 PM

## 2018-05-01 NOTE — Progress Notes (Signed)
Brian Suarez to be D/C'd to home per MD order.  Discussed with the patient and all questions fully answered.  VSS, Skin clean, dry and intact without evidence of skin break down, no evidence of skin tears noted. IV catheter discontinued intact. Site without signs and symptoms of complications. Dressing and pressure applied.  An After Visit Summary was printed and given to the patient.  D/c education completed with patient/family including follow up instructions, medication list, d/c activities limitations if indicated, with other d/c instructions as indicated by MD - patient able to verbalize understanding, all questions fully answered.   Patient instructed to return to ED, call 911, or call MD for any changes in condition.   Patient escorted via [place], and D/C home via private auto.  [name] 05/01/2018 1:47 PM

## 2018-05-01 NOTE — Care Management Obs Status (Signed)
MEDICARE OBSERVATION STATUS NOTIFICATION   Patient Details  Name: Brian Suarez MRN: [id number] Date of Birth: Feb 22, 1946   Medicare Observation Status Notification Given:  Yes    Sharin Mons, RN 05/01/2018, 1:50 PM

## 2018-05-02 LAB — SIROLIMUS LEVEL: SIROLIMUS (RAPAMYCIN): 1.9 ng/mL — AB (ref 3.0–20.0)

## 2018-05-27 ENCOUNTER — Encounter: Payer: Medicare Other | Attending: Internal Medicine | Admitting: Skilled Nursing Facility1

## 2018-05-27 DIAGNOSIS — Z713 Dietary counseling and surveillance: Secondary | ICD-10-CM | POA: Insufficient documentation

## 2018-05-27 DIAGNOSIS — N185 Chronic kidney disease, stage 5: Secondary | ICD-10-CM | POA: Diagnosis not present

## 2018-05-27 DIAGNOSIS — E1122 Type 2 diabetes mellitus with diabetic chronic kidney disease: Secondary | ICD-10-CM | POA: Diagnosis present

## 2018-05-27 DIAGNOSIS — E0800 Diabetes mellitus due to underlying condition with hyperosmolarity without nonketotic hyperglycemic-hyperosmolar coma (NKHHC): Secondary | ICD-10-CM

## 2018-05-27 NOTE — Progress Notes (Signed)
Pt and wife came to the appointment with labs (from 05/12/18) as well as blood sugar logs. Pt's wife stated they have closely watched pt's lab values since heart transplant. Pt reported UBW is 215-220 lb. Pt also has Stage V CKD.   Hemoglobin A1c 12 Hemoglobin 10.8 BUN 107 Creatinine 4.44 (pt reported 3.7 most recent taken at [facility]) GFR 12  Sodium 135 Potassium 3.8  Calcium 8.6 Phosphorus 4.1  Pt stated he checks his blood sugar 4 times per day: fasting/before breakfast, before lunch, before dinner, and before bed. Pt and wife closely track pt's blood sugar and dose insulin accordingly. Pt's wife expressed concern with properly dosing insulin, so RD explained to pt how to treat low blood sugar. Pt stated he keeps apple juice on hand to treat low blood sugar.   Most Recent Blood Glucose Readings Fasting: 139, 89, 143 Pre-Prandial: 100-153   Pt stated avoided foods include desserts, cheese/dairy (since decline in kidney function,) orange juice, bananas.  Pt's wife stated she is purchasing more whole and frozen produce (vs canned) to reduce sodium. Pt stated he saw a physical therapist following a 3 week hospitalization and since has been unable to do much physical activity due to leg pain.   Goals -Aim to incorporate arm chair exercises throughout the week  -Work on physical therapy exercises daily to strengthen leg  -Adhere to renal friendly and carbohydrate controlled diet  Handouts -Arm chair exercises  -Low sodium flavoring tips  -Carb counting  -Choose a meal   Diabetes Self-Management Education  Visit Type: First/Initial  Appt. Start Time: 10:00am Appt. End Time: 11:00am  05/27/2018  Brian Suarez, identified by name and date of birth, is a 72 y.o. male with a diagnosis of Diabetes: Type 2.   ASSESSMENT  Height 6\' 2"  (1.88 m), weight 217 lb 8 oz (98.7 kg). Body mass index is 27.93 kg/m.  Diabetes Self-Management Education - 05/27/18 1006      Visit Information   Visit Type  First/Initial      Initial Visit   Diabetes Type  Type 2    Are you currently following a meal plan?  No    Are you taking your medications as prescribed?  Yes    Date Diagnosed  04/29/18      Health Coping   How would you rate your overall health?  Good      Psychosocial Assessment   Patient Belief/Attitude about Diabetes  Motivated to manage diabetes    Other persons present  Spouse/SO      Pre-Education Assessment   Patient understands the diabetes disease and treatment process.  Needs Instruction    Patient understands incorporating nutritional management into lifestyle.  Needs Instruction    Patient undertands incorporating physical activity into lifestyle.  Needs Instruction    Patient understands using medications safely.  Needs Instruction    Patient understands monitoring blood glucose, interpreting and using results  Needs Instruction    Patient understands prevention, detection, and treatment of acute complications.  Needs Instruction    Patient understands prevention, detection, and treatment of chronic complications.  Needs Instruction    Patient understands how to develop strategies to address psychosocial issues.  Needs Instruction    Patient understands how to develop strategies to promote health/change behavior.  Needs Instruction      Complications   Last HgB A1C per patient/outside source  12 %    How often do you check your blood sugar?  3-4 times/day  Fasting Blood glucose range (mg/dL)  130-179    Postprandial Blood glucose range (mg/dL)  130-179    Number of hypoglycemic episodes per month  3    Can you tell when your blood sugar is low?  Yes    What do you do if your blood sugar is low?  eat    Have you had a dilated eye exam in the past 12 months?  Yes    Have you had a dental exam in the past 12 months?  Yes    Are you checking your feet?  Yes    How many days per week are you checking your feet?  7      Dietary Intake   Breakfast  1 plain  waffle or 1 egg or both    Lunch  tomato or chicken sandwich    Dinner  chicken, green vegetable, potato or rice, cabbage    Beverage(s)  water, unsweet tea       Exercise   Exercise Type  ADL's;Light (walking / raking leaves)    How many days per week to you exercise?  0    How many minutes per day do you exercise?  0    Total minutes per week of exercise  0      Patient Education   Previous Diabetes Education  Yes (please comment)   doctor   Disease state   Definition of diabetes, type 1 and 2, and the diagnosis of diabetes    Nutrition management   Carbohydrate counting;Role of diet in the treatment of diabetes and the relationship between the three main macronutrients and blood glucose level;Food label reading, portion sizes and measuring food.;Reviewed blood glucose goals for pre and post meals and how to evaluate the patients' food intake on their blood glucose level.;Meal options for control of blood glucose level and chronic complications.    Monitoring  Purpose and frequency of SMBG.;Daily foot exams    Acute complications  Taught treatment of hypoglycemia - the 15 rule.    Chronic complications  Assessed and discussed foot care and prevention of foot problems;Identified and discussed with patient  current chronic complications;Relationship between chronic complications and blood glucose control;Lipid levels, blood glucose control and heart disease      Individualized Goals (developed by patient)   Nutrition  Follow meal plan discussed    Physical Activity  Not Applicable    Medications  take my medication as prescribed      Post-Education Assessment   Patient understands the diabetes disease and treatment process.  Demonstrates understanding / competency    Patient understands incorporating nutritional management into lifestyle.  Demonstrates understanding / competency    Patient undertands incorporating physical activity into lifestyle.  Demonstrates understanding / competency     Patient understands using medications safely.  Demonstrates understanding / competency    Patient understands monitoring blood glucose, interpreting and using results  Demonstrates understanding / competency    Patient understands prevention, detection, and treatment of acute complications.  Demonstrates understanding / competency    Patient understands prevention, detection, and treatment of chronic complications.  Demonstrates understanding / competency    Patient understands how to develop strategies to address psychosocial issues.  Demonstrates understanding / competency    Patient understands how to develop strategies to promote health/change behavior.  Demonstrates understanding / competency      Outcomes   Expected Outcomes  Demonstrated interest in learning. Expect positive outcomes    Future DMSE  PRN  Program Status  Completed       Individualized Plan for Diabetes Self-Management Training:   Learning Objective:  Patient will have a greater understanding of diabetes self-management. Patient education plan is to attend individual and/or group sessions per assessed needs and concerns.   Plan:   There are no Patient Instructions on file for this visit.  Expected Outcomes:  Demonstrated interest in learning. Expect positive outcomes  Education material provided: ADA Diabetes: Your Take Control Guide and Meal plan card  If problems or questions, patient to contact team via:  Phone  Future DSME appointment: PRN

## 2018-09-26 ENCOUNTER — Ambulatory Visit: Payer: Medicare Other | Admitting: Internal Medicine

## 2018-09-26 ENCOUNTER — Encounter: Payer: Self-pay | Admitting: Internal Medicine

## 2018-09-26 VITALS — BP 128/74 | HR 83 | Ht 73.0 in | Wt 215.4 lb

## 2018-09-26 DIAGNOSIS — Z941 Heart transplant status: Secondary | ICD-10-CM | POA: Diagnosis not present

## 2018-09-26 DIAGNOSIS — G4733 Obstructive sleep apnea (adult) (pediatric): Secondary | ICD-10-CM | POA: Diagnosis not present

## 2018-09-26 NOTE — Assessment & Plan Note (Signed)
Doing great to have had this transplant since 2002.  Continues follow-up at [facility].

## 2018-09-26 NOTE — Progress Notes (Signed)
Subjective:    Patient ID: Brian Suarez, male    DOB: 1946/08/21, 73 y.o.   MRN: [id number]  HPI M Never smoker followed for OSA, complicated by history heart transplant/CellCept/cyclosporine, HBP, gout, CKD 4, DM 2,   ------------------------------------------------------------------------- 09/26/17- 73 year old male never smoker followed for OSA, complicated by history heart transplant/CellCept/cyclosporine, HBP, gout, CKD CPAP auto 5-20 [place] ----OSA: DME; [facility]. Pt wears CPAP nightly and DL attached. No new supplies needed at this time and pressure works well for patient.  Heart transplant is working well but he is developing significant chronic kidney disease anticipating he may require dialysis. Very comfortable with his CPAP, sleeping well with it and definitely feels he benefits.  Pressures are comfortable.  Download 96% compliance, AHI 1.0/hour.  09/26/2018- 73 year old male never smoker followed for OSA, complicated by history heart transplant 2002/CellCept/cyclosporine, HBP, gout, CKD 4, DM 2,  CPAP auto 5-20 [place] follows for heart transplant and CKD Download 93% compliance AHI 0.9/hour Comfortable with his CPAP and reports no problems. Hospitalized for nonketotic hyperosmolar state and diagnosed with DM2 this year. Still potential candidate for kidney replacement but told he needs a living donor. Says breathing is comfortable. CXR 04/30/2018-  IMPRESSION: No active disease. Mild aortic atherosclerosis.  ROS-see HPI   + = positive Constitutional:    weight loss, night sweats, fevers, chills, fatigue, lassitude. HEENT:    headaches, difficulty swallowing, tooth/dental problems, sore throat,       sneezing, itching, ear ache, nasal congestion, post nasal drip, snoring CV:    chest pain, orthopnea, PND, swelling in lower extremities, anasarca,                                             dizziness, palpitations Resp:    shortness of breath with exertion or at rest.                      productive cough,   non-productive cough, coughing up of blood.              change in color of mucus.  wheezing.   Skin:    rash or lesions. GI:  No-   heartburn, indigestion, abdominal pain, nausea, vomiting, diarrhea,                 change in bowel habits, loss of appetite GU: dysuria, change in color of urine, no urgency or frequency.   flank pain. MS:   joint pain, stiffness, decreased range of motion, back pain. Neuro-     nothing unusual Psych:  change in mood or affect.  depression or anxiety.   memory loss.    Objective:   OBJ- Physical Exam General- Alert, Oriented, Affect-appropriate, Distress- none acute, + overweight Skin- rash-none, lesions- none, excoriation- none Lymphadenopathy- none Head- atraumatic            Eyes- Gross vision intact, PERRLA, conjunctivae and secretions clear            Ears- Hearing, canals-normal            Nose- Clear, no-Septal dev, mucus, polyps, erosion, perforation             Throat- Mallampati II , mucosa clear , drainage- none, tonsils- atrophic Neck- flexible , trachea midline, no stridor , thyroid nl, carotid no bruit Chest -  symmetrical excursion , unlabored           Heart/CV- RRR , no murmur , no gallop  , no rub, nl s1 s2                           - JVD- none , edema- none, stasis changes- none, varices- none           Lung- clear to P&A, wheeze- none, cough- none , dullness-none, rub- none           Chest wall-  Abd-  Br/ Gen/ Rectal- Not done, not indicated Extrem- cyanosis- none, clubbing, none, atrophy- none, strength- nl, + cane Neuro- grossly intact to observation    Assessment & Plan:

## 2018-09-26 NOTE — Patient Instructions (Signed)
We can continue CPAP auto 5-20, mask of choice, humidifier, supplies, airView/ card  Please call if we can  help

## 2018-09-26 NOTE — Assessment & Plan Note (Signed)
He continues to benefit from CPAP with improved sleep.  Download confirms good compliance and control.  We reviewed comfort issues. Plan-continue CPAP auto 5-20

## 2019-01-28 ENCOUNTER — Emergency Department (HOSPITAL_COMMUNITY): Payer: Medicare Other

## 2019-01-28 ENCOUNTER — Encounter (HOSPITAL_COMMUNITY): Payer: Self-pay

## 2019-01-28 ENCOUNTER — Other Ambulatory Visit: Payer: Self-pay

## 2019-01-28 ENCOUNTER — Emergency Department (HOSPITAL_COMMUNITY)
Admission: EM | Admit: 2019-01-28 | Discharge: 2019-01-29 | Disposition: A | Discharge status: Other Acute Inpatient Hospital | Payer: Medicare Other | Attending: Emergency Medicine | Admitting: Emergency Medicine

## 2019-01-28 DIAGNOSIS — Z20828 Contact with and (suspected) exposure to other viral communicable diseases: Secondary | ICD-10-CM | POA: Insufficient documentation

## 2019-01-28 DIAGNOSIS — I129 Hypertensive chronic kidney disease with stage 1 through stage 4 chronic kidney disease, or unspecified chronic kidney disease: Secondary | ICD-10-CM | POA: Diagnosis not present

## 2019-01-28 DIAGNOSIS — Z79899 Other long term (current) drug therapy: Secondary | ICD-10-CM | POA: Diagnosis not present

## 2019-01-28 DIAGNOSIS — N184 Chronic kidney disease, stage 4 (severe): Secondary | ICD-10-CM | POA: Diagnosis not present

## 2019-01-28 DIAGNOSIS — K56699 Other intestinal obstruction unspecified as to partial versus complete obstruction: Secondary | ICD-10-CM | POA: Insufficient documentation

## 2019-01-28 DIAGNOSIS — E1122 Type 2 diabetes mellitus with diabetic chronic kidney disease: Secondary | ICD-10-CM | POA: Insufficient documentation

## 2019-01-28 DIAGNOSIS — Z7982 Long term (current) use of aspirin: Secondary | ICD-10-CM | POA: Diagnosis not present

## 2019-01-28 DIAGNOSIS — K56609 Unspecified intestinal obstruction, unspecified as to partial versus complete obstruction: Secondary | ICD-10-CM

## 2019-01-28 DIAGNOSIS — Z794 Long term (current) use of insulin: Secondary | ICD-10-CM | POA: Diagnosis not present

## 2019-01-28 DIAGNOSIS — R109 Unspecified abdominal pain: Secondary | ICD-10-CM | POA: Diagnosis present

## 2019-01-28 LAB — CBC WITH DIFFERENTIAL/PLATELET
Abs Immature Granulocytes: 0.08 10*3/uL — ABNORMAL HIGH (ref 0.00–0.07)
Basophils Absolute: 0 10*3/uL (ref 0.0–0.1)
Basophils Relative: 0 %
Eosinophils Absolute: 0 10*3/uL (ref 0.0–0.5)
Eosinophils Relative: 0 %
HCT: 41.4 % (ref 39.0–52.0)
Hemoglobin: 13.4 g/dL (ref 13.0–17.0)
Immature Granulocytes: 1 %
Lymphocytes Relative: 7 %
Lymphs Abs: 0.8 10*3/uL (ref 0.7–4.0)
MCH: 28.2 pg (ref 26.0–34.0)
MCHC: 32.4 g/dL (ref 30.0–36.0)
MCV: 87 fL (ref 80.0–100.0)
Monocytes Absolute: 0.8 10*3/uL (ref 0.1–1.0)
Monocytes Relative: 7 %
Neutro Abs: 9.6 10*3/uL — ABNORMAL HIGH (ref 1.7–7.7)
Neutrophils Relative %: 85 %
Platelets: 168 10*3/uL (ref 150–400)
RBC: 4.76 MIL/uL (ref 4.22–5.81)
RDW: 14.6 % (ref 11.5–15.5)
WBC: 11.3 10*3/uL — ABNORMAL HIGH (ref 4.0–10.5)
nRBC: 0 % (ref 0.0–0.2)

## 2019-01-28 LAB — COMPREHENSIVE METABOLIC PANEL
ALT: 32 U/L (ref 0–44)
AST: 30 U/L (ref 15–41)
Albumin: 4.4 g/dL (ref 3.5–5.0)
Alkaline Phosphatase: 108 U/L (ref 38–126)
Anion gap: 15 (ref 5–15)
BUN: 105 mg/dL — ABNORMAL HIGH (ref 8–23)
CO2: 24 mmol/L (ref 22–32)
Calcium: 9.8 mg/dL (ref 8.9–10.3)
Chloride: 99 mmol/L (ref 98–111)
Creatinine, Ser: 4.04 mg/dL — ABNORMAL HIGH (ref 0.61–1.24)
GFR calc Af Amer: 16 mL/min — ABNORMAL LOW (ref >60)
GFR calc non Af Amer: 14 mL/min — ABNORMAL LOW (ref >60)
Glucose, Bld: 135 mg/dL — ABNORMAL HIGH (ref 70–99)
Potassium: 3.2 mmol/L — ABNORMAL LOW (ref 3.5–5.1)
Sodium: 138 mmol/L (ref 135–145)
Total Bilirubin: 1.7 mg/dL — ABNORMAL HIGH (ref 0.3–1.2)
Total Protein: 8.2 g/dL — ABNORMAL HIGH (ref 6.5–8.1)

## 2019-01-28 LAB — SARS CORONAVIRUS 2 BY RT PCR (HOSPITAL ORDER, PERFORMED IN ~~LOC~~ HOSPITAL LAB): SARS Coronavirus 2: NEGATIVE

## 2019-01-28 LAB — LACTIC ACID, PLASMA: Lactic Acid, Venous: 1.9 mmol/L (ref 0.5–1.9)

## 2019-01-28 MED ORDER — MORPHINE SULFATE (PF) 2 MG/ML IV SOLN
2.0000 mg | Freq: Once | INTRAVENOUS | Status: AC
  Administered 2019-01-28: 19:00:00 2 mg via INTRAVENOUS
  Filled 2019-01-28: qty 1

## 2019-01-28 MED ORDER — ONDANSETRON HCL 4 MG/2ML IJ SOLN
4.0000 mg | Freq: Once | INTRAMUSCULAR | Status: AC
  Administered 2019-01-28: 20:00:00 4 mg via INTRAVENOUS
  Filled 2019-01-28: qty 2

## 2019-01-28 MED ORDER — SODIUM CHLORIDE 0.9 % IV SOLN
INTRAVENOUS | Status: DC
  Administered 2019-01-28: 20:00:00 via INTRAVENOUS

## 2019-01-28 MED ORDER — LIDOCAINE HCL URETHRAL/MUCOSAL 2 % EX GEL
1.0000 "application " | Freq: Once | CUTANEOUS | Status: AC
  Administered 2019-01-28: 1
  Filled 2019-01-28: qty 5

## 2019-01-28 NOTE — ED Notes (Signed)
XR at bedside

## 2019-01-28 NOTE — ED Notes (Signed)
NG tube repositioned with assistance from charge RN. Imaging reordered.

## 2019-01-28 NOTE — ED Notes (Signed)
Per MD, keep NG tube in place with low intermittent suctioning.

## 2019-01-28 NOTE — ED Triage Notes (Signed)
Pt BIB EMS from home. Pt reports abdominal pain starting Monday and has gotten worse today. Pt has been diagnosed with hernia and has sharp pains intermittently. Pt reports trying to have a bowel movement around lunchtime and reports he had trouble so he took a laxative and has not gone to bathroom since. Pt reports her think the hernia is causing a blockage.   CBG 149 BP 195/116 HR 90 98% RA

## 2019-01-28 NOTE — ED Notes (Signed)
[facility] called for transport.

## 2019-01-28 NOTE — ED Provider Notes (Signed)
[facility] DEPT Provider Note   CSN: [id number] Arrival date & time: 01/28/19  1801    History   Chief Complaint Chief Complaint  Patient presents with   Abdominal Pain   Hernia    HPI Brian Suarez is a 73 y.o. male.     HPI Patient presents after developing nausea, vomiting, increasing pain. 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 Typically patient has soft hernia that is reducible. However, over the past 2 days he has noticed increased protuberance about his left abdomen, with pain worse with eating, and nausea, vomiting. Bowel movements are now inconsistent as well. No fever, no cough, no dyspnea.  Past Medical History:  Diagnosis Date   Chronic renal insufficiency    Gout    Heart transplanted ([place])    Hypercholesteremia    Hypertension    OSA (obstructive sleep apnea)     Patient Active Problem List   Diagnosis Date Noted   Type 2 diabetes mellitus with hyperosmolar nonketotic hyperglycemia ([place]) 04/30/2018   Essential hypertension 04/30/2018   Gout 04/30/2018   Prediabetes 04/30/2018   HLD (hyperlipidemia) 04/30/2018   Diabetes mellitus due to underlying condition with hyperosmolarity without nonketotic hyperglycemic-hyperosmolar coma [facility]) ([place]) 04/30/2018   Acute renal failure superimposed on stage 4 chronic kidney disease ([place]) 09/26/2017   Heart transplant recipient [facility]) 07/31/2016   OSA (obstructive sleep apnea) 05/29/2012    Past Surgical History:  Procedure Laterality Date   CARDIAC SURGERY  2005   heart cath   CYSTECTOMY     EPIDERMOID   ENDOMYOCARDIAL BIOPSY  2005   HEART TRANSPLANT  2002   [facility]     PERICARDIOCENTESIS  2010   PERICARDIUM SURGERY  2002   BX         Home Medications    Prior to Admission medications   Medication Sig Start Date End Date Taking? Authorizing Provider  allopurinol  (ZYLOPRIM) 300 MG tablet Take 300 mg by mouth daily.    [provider]  aspirin EC 81 MG tablet Take 81 mg by mouth daily.    [provider]  atorvastatin (LIPITOR) 40 MG tablet Take 40 mg by mouth every evening.     [provider]  calcitRIOL (ROCALTROL) 0.25 MCG capsule Take 0.25 mcg by mouth daily.    [provider]  calcium-vitamin D (OSCAL WITH D) 500-200 MG-UNIT per tablet Take 1 tablet by mouth daily.    [provider]  carvedilol (COREG) 12.5 MG tablet Take 25 mg by mouth 2 (two) times daily with a meal.     [provider]  fish oil-omega-3 fatty acids 1000 MG capsule Take 2 g by mouth 2 (two) times daily.     [provider]  furosemide (LASIX) 40 MG tablet Take 40-80 mg by mouth 2 (two) times daily.     [provider]  hydrALAZINE (APRESOLINE) 50 MG tablet Take 75 mg by mouth 2 (two) times daily.     [provider]  Insulin Glargine (LANTUS) 100 UNIT/ML Solostar Pen Inject 30 Units into the skin every morning. 05/01/18   Samuella Cota, MD  insulin lispro (HUMALOG KWIKPEN) 100 UNIT/ML KiwkPen Inject 0.04 mLs (4 Units total) into the skin 3 (three) times daily with meals. 05/01/18   Samuella Cota, MD  Insulin Pen Needle 31G X 5 MM MISC Use as directed 05/01/18  Samuella Cota, MD  magnesium oxide (MAG-OX) 400 MG tablet Take 400 mg by mouth every evening.     [provider]  Multiple Vitamin (MULTIVITAMIN WITH MINERALS) TABS Take 1 tablet by mouth daily.    [provider]  mycophenolate (CELLCEPT) 500 MG tablet Take 500 mg by mouth 2 (two) times daily.     [provider]  sennosides-docusate sodium (SENOKOT-S) 8.6-50 MG tablet Take 1-2 tablets by mouth 2 (two) times daily as needed for constipation.     [provider]  sirolimus (RAPAMUNE) 1 MG tablet Take 1 mg by mouth daily.    [provider]    Family History Family History  Problem  Relation Age of Onset   Dementia Mother    Other Mother        HYPOTENSTION   Throat cancer Father     Social History Social History   Tobacco Use   Smoking status: Never Smoker   Smokeless tobacco: Never Used  Substance Use Topics   Alcohol use: No   Drug use: No     Allergies   Rapamune [sirolimus] and Verapamil   Review of Systems Review of Systems  Constitutional:       Per HPI, otherwise negative  HENT:       Per HPI, otherwise negative  Respiratory:       Per HPI, otherwise negative  Cardiovascular:       Per HPI, otherwise negative  Gastrointestinal: Positive for abdominal pain, nausea and vomiting.  Endocrine:       Negative aside from HPI  Genitourinary:       Neg aside from HPI   Musculoskeletal:       Per HPI, otherwise negative  Skin: Negative.   Neurological: Negative for syncope.     Physical Exam Updated Vital Signs BP (!) 190/106    Pulse 90    Temp 97.6 F (36.4 C) (Oral)    Resp 20    SpO2 97%   Physical Exam Vitals signs and nursing note reviewed.  Constitutional:      General: He is not in acute distress.    Appearance: He is well-developed.  HENT:     Head: Normocephalic and atraumatic.  Eyes:     Conjunctiva/sclera: Conjunctivae normal.  Cardiovascular:     Rate and Rhythm: Normal rate and regular rhythm.  Pulmonary:     Effort: Pulmonary effort is normal. No respiratory distress.     Breath sounds: No stridor.  Abdominal:     General: There is no distension.     Tenderness: There is abdominal tenderness.     Hernia: A hernia is present.    Skin:    General: Skin is warm and dry.  Neurological:     Mental Status: He is alert and oriented to person, place, and time.      ED Treatments / Results  Labs (all labs ordered are listed, but only abnormal results are displayed) Labs Reviewed  COMPREHENSIVE METABOLIC PANEL - Abnormal; Notable for the following components:      Result Value   Potassium 3.2 (*)     Glucose, Bld 135 (*)    BUN 105 (*)    Creatinine, Ser 4.04 (*)    Total Protein 8.2 (*)    Total Bilirubin 1.7 (*)    GFR calc non Af Amer 14 (*)    GFR calc Af Amer 16 (*)    All other components within normal limits  CBC WITH DIFFERENTIAL/PLATELET - Abnormal; Notable for the following components:   WBC 11.3 (*)    Neutro Abs 9.6 (*)    Abs Immature Granulocytes 0.08 (*)    All other components within normal limits  LACTIC ACID, PLASMA    EKG None  Radiology Ct Abdomen Pelvis Wo Contrast  Result Date: 01/28/2019 CLINICAL DATA:  Abdominal pain with known large ventral hernia EXAM: CT ABDOMEN AND PELVIS WITHOUT CONTRAST TECHNIQUE: Multidetector CT imaging of the abdomen and pelvis was performed following the standard protocol without oral or IV contrast. COMPARISON:  CT abdomen pelvis February 23, 2012 FINDINGS: Lower chest: There are scattered granulomas. There is no lung base edema or consolidation. There are foci of coronary artery calcification. There is a fairly small hiatal hernia. Hepatobiliary: No focal liver lesions are evident on this noncontrast enhanced study. The gallbladder wall is not appreciably thickened. There is no biliary duct dilatation. Pancreas: There is no pancreatic mass or inflammatory focus. Spleen: No splenic lesions are evident. Adrenals/Urinary Tract: Adrenals bilaterally appear normal. There are several cysts in the right kidney. The largest cyst on the right measures 3.8 x 3.7 cm. This cyst is located inferomedially on the right. There is mild calcification in the wall of this cyst. There is no hydronephrosis on either side. There is no evident renal or ureteral calculus on either side. Urinary bladder is midline with wall thickness within normal limits. Stomach/Bowel: There are large ventral hernias in the mid abdomen. Multiple loops of small bowel extend into these hernias. The loops of small bowel within these hernias are diffusely dilated. There is a transition  zone along the rightward aspect of the right-sided abdominal wall hernia involving the mid to distal ileal region consistent with bowel obstruction. No free air or portal venous air is evident. Colon is largely decompressed with fairly mild stool in the colon. Vascular/Lymphatic: There is aortoiliac atherosclerosis. No aneurysm evident. There is no demonstrable adenopathy in the abdomen or pelvis. Reproductive: There are occasional prostatic calculi. Prostate and seminal vesicles are normal in size and contour. Other: There is no periappendiceal region inflammatory change. No abscess or ascites evident in the abdomen or pelvis. Musculoskeletal: There are foci of degenerative change in the lumbar spine. There is a probable bone island in the left subcapital femoral neck region. There is narrowing of each hip joint, symmetric. No blastic or lytic bone lesions are identified. No intramuscular lesions are evident. IMPRESSION: 1. There are large ventral hernias both to the left and right of midline, containing multiple loops of dilated small bowel. There is a transition zone consistent with a degree of small bowel obstruction on the right involving the mid to distal ileum. Along the leftward hernia, the larger of the hernias, there are multiple loops of dilated bowel. Several bowel loops are duct distended within the peritoneum on the left as well. Several of the loops of bowel in the leftward ventral hernia appear mildly twisted. Although overt volvulus is not seen in this area, it is felt that this bowel may be at risk for subsequent volvulus development. None of these bowel loops show appreciable wall thickness currently. There is no evident free air. 2.  No abscess in the abdomen or pelvis. 3.  Small hiatal hernia. 4. Aortoiliac atherosclerosis noted. There are foci of coronary artery calcification. 5.  Mildly complex cyst along the inferomedial right kidney. Electronically Signed   By: Lowella Grip III M.D.    On: 01/28/2019 20:49    Procedures  Procedures (including critical care time)  Medications Ordered in ED Medications  0.9 %  sodium chloride infusion ( Intravenous New Bag/Given (Non-Interop) 01/28/19 1935)  morphine 2 MG/ML injection 2 mg (2 mg Intravenous Given 01/28/19 1908)  ondansetron (ZOFRAN) injection 4 mg (4 mg Intravenous Given 01/28/19 1934)     Initial Impression / Assessment and Plan / ED Course  I have reviewed the triage vital signs and the nursing notes.  Pertinent labs & imaging results that were available during my care of the patient were reviewed by me and considered in my medical decision making (see chart for details).    Update: Patient vomiting.    8:58 PM Patient CT consistent with bowel obstruction, with multiple loops of bowel in multiple ventral hernias, with transition point.  On repeat exam the patient is in similar condition.  I discussed patient case with our general surgeon on-call purulent with consideration of the patient's bowel obstruction, as well as cardiac transplant history, need for ongoing immunosuppression, request is for patient to have transfer to his tertiary care center. Patient is aware of this, is preparing to receive NG tube.  Patient's transplant center physician is Dr. Posey Pronto Patient's prior surgeon is Dr. Linus Salmons  9:54 PM Patient's case discussed with transfer team, and he is accepted for transfer to [facility], Dr. Kyla Balzarine accepts.  Patient is aware of this, tolerated his placement of NG tube well, without complication.  This elderly male with distant history of heart transplant, with known multiple hernias presents with new abdominal pain, swelling, is found to have a bowel obstruction, with loops of bowel in his hernia. Given the patient's need for n.p.o. status, after placement of an NG tube, I discussed this case with the [facility] in which he received his transplant. Patient is accepted for  transfer there to facilitate transition to IV immunosuppressants, and for further monitoring, management. Final Clinical Impressions(s) / ED Diagnoses   Final diagnoses:  SBO (small bowel obstruction) ([place])   CRITICAL CARE Performed by: Carmin Muskrat Total critical care time: 35 minutes Critical care time was exclusive of separately billable procedures and treating other patients. Critical care was necessary to treat or prevent imminent or life-threatening deterioration. Critical care was time spent personally by me on the following activities: development of treatment plan with patient and/or surrogate as well as nursing, discussions with consultants, evaluation of patient's response to treatment, examination of patient, obtaining history from patient or surrogate, ordering and performing treatments and interventions, ordering and review of laboratory studies, ordering and review of radiographic studies, pulse oximetry and re-evaluation of patient's condition.   Carmin Muskrat, MD 01/28/19 2156

## 2019-01-28 NOTE — ED Notes (Signed)
150cc's green bile out of NGT-connected to LWIS-upper abd soft to palpation-large hernia left mid to lower abd area-firm to palpation

## 2019-01-29 MED ORDER — LIP MEDEX EX OINT
TOPICAL_OINTMENT | Freq: Once | CUTANEOUS | Status: AC
  Administered 2019-01-29: 02:00:00 via TOPICAL
  Filled 2019-01-29: qty 7

## 2019-01-29 MED ORDER — MORPHINE SULFATE (PF) 2 MG/ML IV SOLN
2.0000 mg | Freq: Once | INTRAVENOUS | Status: AC
  Administered 2019-01-29: 02:00:00 2 mg via INTRAVENOUS
  Filled 2019-01-29: qty 1

## 2019-01-29 NOTE — ED Notes (Signed)
Patient transported to [facility] via [place] transport in stable condition

## 2019-01-29 NOTE — ED Notes (Signed)
NGT intact on LWIS with small amount green bile drainage

## 2019-01-29 NOTE — ED Provider Notes (Signed)
I assumed care of this patient from Dr. Vanita Panda at 2300.  Please see their note for further details of Hx, PE.  Briefly patient is a 73 y.o. male who presented with abd pain found to have SBO. Given PMH, he will be transferred to [facility]. Currently pending transfer.  No acute changes. Provided with additional pain medicine.  2:15 AM EMTALA updated. Transport here.  The patient is safe for transfer.    Fatima Blank, MD 01/29/19 [phone]

## 2019-01-29 NOTE — ED Notes (Signed)
Mouth care with biotene

## 2019-01-29 NOTE — ED Notes (Signed)
[facility] 1 1/2 hours out-patient has been updated

## 2019-02-10 ENCOUNTER — Emergency Department (HOSPITAL_COMMUNITY): Payer: Medicare Other

## 2019-02-10 ENCOUNTER — Encounter (HOSPITAL_COMMUNITY): Payer: Self-pay

## 2019-02-10 ENCOUNTER — Emergency Department (HOSPITAL_COMMUNITY)
Admission: EM | Admit: 2019-02-10 | Discharge: 2019-02-11 | Payer: Medicare Other | Attending: Emergency Medicine | Admitting: Emergency Medicine

## 2019-02-10 ENCOUNTER — Other Ambulatory Visit: Payer: Self-pay

## 2019-02-10 DIAGNOSIS — K56609 Unspecified intestinal obstruction, unspecified as to partial versus complete obstruction: Secondary | ICD-10-CM | POA: Diagnosis not present

## 2019-02-10 DIAGNOSIS — Z7982 Long term (current) use of aspirin: Secondary | ICD-10-CM | POA: Insufficient documentation

## 2019-02-10 DIAGNOSIS — I129 Hypertensive chronic kidney disease with stage 1 through stage 4 chronic kidney disease, or unspecified chronic kidney disease: Secondary | ICD-10-CM | POA: Diagnosis not present

## 2019-02-10 DIAGNOSIS — Z20828 Contact with and (suspected) exposure to other viral communicable diseases: Secondary | ICD-10-CM | POA: Insufficient documentation

## 2019-02-10 DIAGNOSIS — Z79899 Other long term (current) drug therapy: Secondary | ICD-10-CM | POA: Diagnosis not present

## 2019-02-10 DIAGNOSIS — N184 Chronic kidney disease, stage 4 (severe): Secondary | ICD-10-CM | POA: Insufficient documentation

## 2019-02-10 DIAGNOSIS — E1122 Type 2 diabetes mellitus with diabetic chronic kidney disease: Secondary | ICD-10-CM | POA: Insufficient documentation

## 2019-02-10 DIAGNOSIS — Z794 Long term (current) use of insulin: Secondary | ICD-10-CM | POA: Insufficient documentation

## 2019-02-10 DIAGNOSIS — R1030 Lower abdominal pain, unspecified: Secondary | ICD-10-CM | POA: Diagnosis present

## 2019-02-10 DIAGNOSIS — Z941 Heart transplant status: Secondary | ICD-10-CM | POA: Diagnosis not present

## 2019-02-10 LAB — CBC WITH DIFFERENTIAL/PLATELET
Abs Immature Granulocytes: 0.15 10*3/uL — ABNORMAL HIGH (ref 0.00–0.07)
Basophils Absolute: 0 10*3/uL (ref 0.0–0.1)
Basophils Relative: 0 %
Eosinophils Absolute: 0 10*3/uL (ref 0.0–0.5)
Eosinophils Relative: 0 %
HCT: 39.6 % (ref 39.0–52.0)
Hemoglobin: 12.8 g/dL — ABNORMAL LOW (ref 13.0–17.0)
Immature Granulocytes: 1 %
Lymphocytes Relative: 4 %
Lymphs Abs: 0.7 10*3/uL (ref 0.7–4.0)
MCH: 28.3 pg (ref 26.0–34.0)
MCHC: 32.3 g/dL (ref 30.0–36.0)
MCV: 87.4 fL (ref 80.0–100.0)
Monocytes Absolute: 0.7 10*3/uL (ref 0.1–1.0)
Monocytes Relative: 5 %
Neutro Abs: 13.4 10*3/uL — ABNORMAL HIGH (ref 1.7–7.7)
Neutrophils Relative %: 90 %
Platelets: 161 10*3/uL (ref 150–400)
RBC: 4.53 MIL/uL (ref 4.22–5.81)
RDW: 14.9 % (ref 11.5–15.5)
WBC: 15 10*3/uL — ABNORMAL HIGH (ref 4.0–10.5)
nRBC: 0 % (ref 0.0–0.2)

## 2019-02-10 LAB — URINALYSIS, ROUTINE W REFLEX MICROSCOPIC
Bacteria, UA: NONE SEEN
Bilirubin Urine: NEGATIVE
Glucose, UA: NEGATIVE mg/dL
Hgb urine dipstick: NEGATIVE
Ketones, ur: NEGATIVE mg/dL
Leukocytes,Ua: NEGATIVE
Nitrite: NEGATIVE
Protein, ur: 100 mg/dL — AB
Specific Gravity, Urine: 1.01 (ref 1.005–1.030)
pH: 6 (ref 5.0–8.0)

## 2019-02-10 LAB — COMPREHENSIVE METABOLIC PANEL
ALT: 45 U/L — ABNORMAL HIGH (ref 0–44)
AST: 40 U/L (ref 15–41)
Albumin: 4.1 g/dL (ref 3.5–5.0)
Alkaline Phosphatase: 124 U/L (ref 38–126)
Anion gap: 16 — ABNORMAL HIGH (ref 5–15)
BUN: 73 mg/dL — ABNORMAL HIGH (ref 8–23)
CO2: 22 mmol/L (ref 22–32)
Calcium: 10.1 mg/dL (ref 8.9–10.3)
Chloride: 99 mmol/L (ref 98–111)
Creatinine, Ser: 3.81 mg/dL — ABNORMAL HIGH (ref 0.61–1.24)
GFR calc Af Amer: 17 mL/min — ABNORMAL LOW (ref >60)
GFR calc non Af Amer: 15 mL/min — ABNORMAL LOW (ref >60)
Glucose, Bld: 125 mg/dL — ABNORMAL HIGH (ref 70–99)
Potassium: 4.6 mmol/L (ref 3.5–5.1)
Sodium: 137 mmol/L (ref 135–145)
Total Bilirubin: 1.2 mg/dL (ref 0.3–1.2)
Total Protein: 7.9 g/dL (ref 6.5–8.1)

## 2019-02-10 LAB — LACTIC ACID, PLASMA: Lactic Acid, Venous: 1.3 mmol/L (ref 0.5–1.9)

## 2019-02-10 MED ORDER — ONDANSETRON HCL 4 MG/2ML IJ SOLN
4.0000 mg | Freq: Once | INTRAMUSCULAR | Status: AC
  Administered 2019-02-10: 23:00:00 4 mg via INTRAVENOUS
  Filled 2019-02-10: qty 2

## 2019-02-10 MED ORDER — MORPHINE SULFATE (PF) 2 MG/ML IV SOLN
2.0000 mg | Freq: Once | INTRAVENOUS | Status: AC
  Administered 2019-02-10: 23:00:00 2 mg via INTRAVENOUS
  Filled 2019-02-10: qty 1

## 2019-02-10 NOTE — ED Notes (Signed)
EKG given to EDP,Plunkett,MD., for review. 

## 2019-02-10 NOTE — ED Provider Notes (Signed)
[facility] DEPT Provider Note   CSN: [id number] Arrival date & time: 02/10/19  2209    History   Chief Complaint Chief Complaint  Patient presents with   Abdominal Pain    HPI Brian Suarez is a 73 y.o. male with a history of cardiac transplant in 2002 on Myfortic and Sirolimus, CKD stage IV, IDDM T2 who presents to the emergency department with a chief complaint of abdominal pain.  The patient endorses constant, 8 out of 10, achy pain to the bilateral lower abdomen, left greater than right.  He reports associated swelling to the bilateral lower abdomen, left greater than right.  He has had 3 episodes of nausea and NBNB vomiting today.  He also reports a large, brown hard bowel movement this afternoon.  Reports that he typically has a soft hernia that is reducible.  He denies urinary symptoms, diarrhea, fever, chills, urinary symptoms, chest pain, shortness of breath, back pain.   Patient was recently admitted to [facility] and has history of cardiac transplant after he was found to have a small bowel obstruction with bilateral ventral hernias.  CT abdomen pelvis revealed incarcerated bowel and 1 hernia and concern for possible volvulus.  General surgery reduced hernias at bedside and NG tube was placed for decompression.  SBO resolved after Gastrografin study.   Patient's current immunosuppression regimen is sirolimus 1 every other day and MMF 500 BID. He reports that he has been compliant with his home immunosuppression regimen, but has not taken his nighttime dose of MMF.     The history is provided by the patient. No language interpreter was used.    Past Medical History:  Diagnosis Date   Chronic renal insufficiency    Gout    Heart transplanted ([place])    Hypercholesteremia    Hypertension    OSA (obstructive sleep apnea)     Patient Active Problem List   Diagnosis Date Noted   Type 2 diabetes mellitus with hyperosmolar nonketotic  hyperglycemia ([place]) 04/30/2018   Essential hypertension 04/30/2018   Gout 04/30/2018   Prediabetes 04/30/2018   HLD (hyperlipidemia) 04/30/2018   Diabetes mellitus due to underlying condition with hyperosmolarity without nonketotic hyperglycemic-hyperosmolar coma [facility]) ([place]) 04/30/2018   Acute renal failure superimposed on stage 4 chronic kidney disease ([place]) 09/26/2017   Heart transplant recipient [facility]) 07/31/2016   OSA (obstructive sleep apnea) 05/29/2012    Past Surgical History:  Procedure Laterality Date   CARDIAC SURGERY  2005   heart cath   CYSTECTOMY     EPIDERMOID   ENDOMYOCARDIAL BIOPSY  2005   HEART TRANSPLANT  2002   [facility]     PERICARDIOCENTESIS  2010   PERICARDIUM SURGERY  2002   BX         Home Medications    Prior to Admission medications   Medication Sig Start Date End Date Taking? Authorizing Provider  allopurinol (ZYLOPRIM) 300 MG tablet Take 300 mg by mouth daily.   Yes [provider]  aspirin EC 81 MG tablet Take 81 mg by mouth daily.   Yes [provider]  atorvastatin (LIPITOR) 40 MG tablet Take 40 mg by mouth every evening.    Yes [provider]  calcitRIOL (ROCALTROL) 0.25 MCG capsule Take 0.25 mcg by mouth daily.   Yes [provider]  calcium-vitamin D (OSCAL WITH D) 500-200 MG-UNIT per tablet Take 1 tablet by mouth daily.   Yes [provider]  carvedilol (COREG) 12.5 MG tablet Take 25 mg by mouth 2 (two) times daily with a meal.    Yes [provider]  docusate sodium (COLACE) 100 MG capsule Take 100 mg by mouth daily as needed for mild constipation.   Yes [provider]  fish oil-omega-3 fatty acids 1000 MG capsule Take 2 g by mouth 2 (two) times daily.    Yes [provider]  furosemide (LASIX) 40 MG tablet Take 40-80 mg by mouth 2 (two) times daily.    Yes [provider]  hydrALAZINE (APRESOLINE) 50 MG tablet Take 75  mg by mouth 2 (two) times daily.    Yes [provider]  Insulin Glargine (LANTUS) 100 UNIT/ML Solostar Pen Inject 30 Units into the skin every morning. Patient taking differently: Inject 18 Units into the skin every morning.  05/01/18  Yes Samuella Cota, MD  linagliptin (TRADJENTA) 5 MG TABS tablet Take 5 mg by mouth daily.   Yes [provider]  magnesium oxide (MAG-OX) 400 MG tablet Take 400 mg by mouth every evening.    Yes [provider]  Multiple Vitamin (MULTIVITAMIN WITH MINERALS) TABS Take 1 tablet by mouth daily.   Yes [provider]  mycophenolate (CELLCEPT) 500 MG tablet Take 500 mg by mouth 2 (two) times daily.    Yes [provider]  sennosides-docusate sodium (SENOKOT-S) 8.6-50 MG tablet Take 1-2 tablets by mouth 2 (two) times daily as needed for constipation.    Yes [provider]  sirolimus (RAPAMUNE) 1 MG tablet Take 1 mg by mouth every other day.    Yes [provider]  insulin lispro (HUMALOG KWIKPEN) 100 UNIT/ML KiwkPen Inject 0.04 mLs (4 Units total) into the skin 3 (three) times daily with meals. Patient not taking: Reported on 02/10/2019 05/01/18   Samuella Cota, MD  Insulin Pen Needle 31G X 5 MM MISC Use as directed 05/01/18   Samuella Cota, MD    Family History Family History  Problem Relation Age of Onset   Dementia Mother    Other Mother        HYPOTENSTION   Throat cancer Father     Social History Social History   Tobacco Use   Smoking status: Never Smoker   Smokeless tobacco: Never Used  Substance Use Topics   Alcohol use: No   Drug use: No     Allergies   Verapamil   Review of Systems Review of Systems  Constitutional: Negative for appetite change, chills and fever.  Respiratory: Negative for shortness of breath.   Cardiovascular: Negative for chest pain.  Gastrointestinal: Positive for abdominal distention, abdominal pain, constipation, nausea and vomiting.  Negative for anal bleeding, blood in stool, diarrhea and rectal pain.  Genitourinary: Negative for decreased urine volume, discharge, dysuria, hematuria, penile swelling, scrotal swelling and urgency.  Musculoskeletal: Negative for back pain.  Skin: Negative for rash.  Allergic/Immunologic: Negative for immunocompromised state.  Neurological: Negative for dizziness, seizures, weakness and headaches.  Psychiatric/Behavioral: Negative for confusion.     Physical Exam Updated Vital Signs BP 140/73    Pulse 99    Temp 97.9 F (36.6 C)    Resp (!) 24    SpO2 92%   Physical Exam Vitals signs and nursing note reviewed.  Constitutional:      Appearance: He is well-developed.  HENT:     Head: Normocephalic.  Eyes:     Conjunctiva/sclera: Conjunctivae normal.  Neck:     Musculoskeletal: Neck  supple.  Cardiovascular:     Rate and Rhythm: Normal rate and regular rhythm.     Heart sounds: No murmur.  Pulmonary:     Effort: Pulmonary effort is normal.  Abdominal:     General: There is no distension.     Palpations: Abdomen is soft.     Tenderness: There is abdominal tenderness in the right lower quadrant and left lower quadrant. There is guarding.     Hernia: A hernia is present. Hernia is present in the ventral area.     Comments: Large ventral hernia in the left lower quadrant that is significantly tender to palpation.  There is a ventral hernia in the right lower quadrant that is tender to palpation, but much smaller in size when compared to the left.  Skin:    General: Skin is warm and dry.  Neurological:     Mental Status: He is alert.  Psychiatric:        Behavior: Behavior normal.    ED Treatments / Results  Labs (all labs ordered are listed, but only abnormal results are displayed) Labs Reviewed  CBC WITH DIFFERENTIAL/PLATELET - Abnormal; Notable for the following components:      Result Value   WBC 15.0 (*)    Hemoglobin 12.8 (*)    Neutro Abs 13.4 (*)    Abs Immature  Granulocytes 0.15 (*)    All other components within normal limits  COMPREHENSIVE METABOLIC PANEL - Abnormal; Notable for the following components:   Glucose, Bld 125 (*)    BUN 73 (*)    Creatinine, Ser 3.81 (*)    ALT 45 (*)    GFR calc non Af Amer 15 (*)    GFR calc Af Amer 17 (*)    Anion gap 16 (*)    All other components within normal limits  URINALYSIS, ROUTINE W REFLEX MICROSCOPIC - Abnormal; Notable for the following components:   Protein, ur 100 (*)    All other components within normal limits  SARS CORONAVIRUS 2 (HOSPITAL ORDER, [facility])  LACTIC ACID, PLASMA  LACTIC ACID, PLASMA    EKG EKG Interpretation  Date/Time:  Tuesday February 10 2019 22:52:13 EDT Ventricular Rate:  92 PR Interval:    QRS Duration: 116 QT Interval:  378 QTC Calculation: 468 R Axis:   124 Text Interpretation:  Sinus rhythm Incomplete right bundle branch block Borderline ST elevation, anterolateral leads No significant change since last tracing Confirmed by Blanchie Dessert [phone]) on 02/10/2019 10:56:10 PM   Radiology Ct Abdomen Pelvis Wo Contrast  Result Date: 02/11/2019 CLINICAL DATA:  Nausea and vomiting. Bowel obstruction. EXAM: CT ABDOMEN AND PELVIS WITHOUT CONTRAST TECHNIQUE: Multidetector CT imaging of the abdomen and pelvis was performed following the standard protocol without IV contrast. COMPARISON:  Radiographs and CT 01/28/2019 FINDINGS: Lower chest: Mild cardiomegaly. Calcified granuloma in the right lower lobe. No acute airspace disease. Small hiatal hernia. Hepatobiliary: Borderline hepatic steatosis. Questionable slight hepatic contour nodularity. Gallbladder physiologically distended, no calcified stone. No biliary dilatation. Pancreas: No ductal dilatation or inflammation. Spleen: Normal in size without focal abnormality. Adrenals/Urinary Tract: Normal adrenal glands. No hydronephrosis. Multiple bilateral renal cysts. Cyst in the lower right kidney  measures 4.5 cm in has curvilinear wall calcifications. Mild symmetric bilateral perinephric edema. Urinary bladder is physiologically distended. No bladder wall thickening. Stomach/Bowel: Complex lower ventral abdominal wall hernia which appears primarily bilobed. Findings suspicious for recurrent small bowel obstruction with dilated fluid-filled small bowel residing in the  lower abdomen, left and and to a lesser extent right ventral abdominal wall hernias. Right ventral abdominal wall hernia also contains the cecum and proximal ascending colon. Transition point suspected within the right ventral hernia defect. Appendix is also visualized in the right aspect of the hernia sac. There are colonic diverticula without diverticulitis. Mesenteric edema and small amount of free fluid involving the area of dilated small bowel. No pneumatosis or perforation. Vascular/Lymphatic: Aortic atherosclerosis. No aneurysm. No bulky abdominopelvic adenopathy, lack contrast limits detailed assessment. Reproductive: Prominent prostate gland 5.1 cm. Other: Mesenteric edema and small amount of free fluid in the lower abdomen including the ventral abdominal wall hernias. Subcutaneous edema and skin thickening of the lower abdominal pannus. No free air. Musculoskeletal: There are no acute or suspicious osseous abnormalities. Bone island in the right proximal femur. Degenerative change in the spine and hips. IMPRESSION: 1. Complex lower ventral abdominal wall hernia with recurrent small bowel obstruction. Site of transition appears within the right aspect of the lower ventral abdominal wall hernia. No perforation. 2. Colonic diverticulosis without diverticulitis. 3.  Aortic Atherosclerosis (ICD10-I70.0). Electronically Signed   By: Keith Rake M.D.   On: 02/11/2019 00:22   Dg Abdomen 1 View  Result Date: 02/11/2019 CLINICAL DATA:  NG tube repositioned. EXAM: ABDOMEN - 1 VIEW COMPARISON:  One-view chest x-ray 02/11/2019 FINDINGS:  The side port of the NG tube is in the fundus the stomach. The bowel is decompressed. Aerated loops of bowel extend into left lower quadrant hernia. IMPRESSION: 1. Side port of the NG tube is in the stomach. 2. Bowel is decompressed. 3. Left lower quadrant hernia. Electronically Signed   By: San Morelle M.D.   On: 02/11/2019 04:45   Dg Abdomen 1 View  Result Date: 02/11/2019 CLINICAL DATA:  NG tube placement EXAM: ABDOMEN - 1 VIEW COMPARISON:  01/28/2019 FINDINGS: NG tube coils in the stomach with the tip in the fundus. Nonobstructive bowel gas pattern. IMPRESSION: NG tube coils in the stomach with the tip in the fundus. Electronically Signed   By: Rolm Baptise M.D.   On: 02/11/2019 02:40    Procedures .Critical Care Performed by: Joanne Gavel, PA-C Authorized by: Joanne Gavel, PA-C   Critical care provider statement:    Critical care time (minutes):  45   Critical care time was exclusive of:  Separately billable procedures and treating other patients and teaching time   Critical care was necessary to treat or prevent imminent or life-threatening deterioration of the following conditions: small bowel obstruction    Critical care was time spent personally by me on the following activities:  Ordering and performing treatments and interventions, ordering and review of laboratory studies, ordering and review of radiographic studies, re-evaluation of patient's condition, review of old charts, development of treatment plan with patient or surrogate, evaluation of patient's response to treatment, obtaining history from patient or surrogate, examination of patient and discussions with consultants   I assumed direction of critical care for this patient from another provider in my specialty: no     (including critical care time)  Medications Ordered in ED Medications  fentaNYL (SUBLIMAZE) injection 25 mcg (25 mcg Intravenous Given 02/11/19 0336)  morphine 2 MG/ML injection 2 mg (2 mg  Intravenous Given 02/10/19 2250)  ondansetron (ZOFRAN) injection 4 mg (4 mg Intravenous Given 02/10/19 2250)  sodium chloride 0.9 % bolus 500 mL (0 mLs Intravenous Stopped 02/11/19 0147)  morphine 4 MG/ML injection 4 mg (4 mg Intravenous Given 02/11/19 0117)  lidocaine (XYLOCAINE) 2 % jelly 1 application (1 application Other Given 02/11/19 0147)  LORazepam (ATIVAN) injection 1 mg (1 mg Intravenous Given 02/11/19 0349)     Initial Impression / Assessment and Plan / ED Course  I have reviewed the triage vital signs and the nursing notes.  Pertinent labs & imaging results that were available during my care of the patient were reviewed by me and considered in my medical decision making (see chart for details).  73 year old male with a history of cardiac transplant in 2002 on Myfortic and Sirolimus, CKD stage IV, IDDM T2 presenting with bilateral lower abdominal pain and vomiting, onset today.  No constitutional symptoms.  He is hypertensive, but otherwise hemodynamically stable on arrival.  On exam, patient has 2 large ventral hernias that are unable to be reduced on exam in the bilateral lower quadrants.  Both areas are exquisitely tender to palpation.  Of note, patient was recently seen in the ER on June 3 and transferred to [facility], where the patient receives his cardiac transplant care, for small bowel obstruction secondary to incarcerated ventral hernia.  Creatinine is elevated to 3.81 today, but improved from the patient's baseline of 4.0.  Labs are otherwise unremarkable.  Lactate is normal.  CT abdomen pelvis with complex lower ventral abdominal wall hernia with recurrent small bowel obstruction.  Site of transition appears within the right aspect of the lower ventral abdominal wall hernia.  There is no perforation.  The patient was seen and independently evaluated by Dr. Nicholes Stairs- Rolanda Lundborg, attending physician.  Morphine and fentanyl given for pain control with interval improvement in the patient's pain.   Nausea and vomiting controlled with Zofran.  NG tube correctly placed.  Given that the patient's cardiology transplant team is at [facility], the patient would prefer to be transferred for inpatient care.  He was offered his nighttime dose of MMF, but declines at this time.   Clinical Course as of Feb 11 455  Wed Feb 11, 2019  0052 Spoke with Dr. Lawana Pai, critical care intensivist at [facility].  Patient has been temporarily accepted at [facility] and is the waitlist for a room. Dr. Lawana Pai will speak to cardiology transplant team, Dr. Christa See, and call back regarding status.    [MM]  0100 Patient has been accepted at [facility]. Will transfer after COVID test results.    [MM]  [phone] Notified by nursing staff the patient had vomiting x1. Ativan ordered. Shortly after Ativan administration, patient SaO2 briefly dipped to 88-89% before returning to 92-94%. Will continue to monitor.     [MM]    Clinical Course User Index [MM] Lior Hoen A, PA-C    Repeat X-ray after vomiting with correct NG tube placement. EMTALA form completed by Dr. Nicholes Stairs Rasch prior to transfer to [facility].  No further episodes of vomiting.  He remains hemodynamically stable with blood pressure improving to 150s over 70s. Patient is accepted for transfer to facilitate transition to IVU immunosuppressants and for further monitoring and management.     Final Clinical Impressions(s) / ED Diagnoses   Final diagnoses:  Small bowel obstruction [facility])    ED Discharge Orders    None       Joanne Gavel, PA-C 02/11/19 0456    [place], April, MD 02/11/19 [place], April, MD 02/11/19 [id number]

## 2019-02-10 NOTE — ED Notes (Signed)
Bed: FK96 Expected date:  Expected time:  Means of arrival:  Comments: EMS 73 yo male from home abd pain-bowel obstruction hx-after hernia repair-HTN 198/114

## 2019-02-10 NOTE — ED Triage Notes (Signed)
Pt arrived via [facility] with complaints of LLQ abdominal pain, pt recently discharged from [facility] a week ago for a bowel obstruction. Reports some difficulty having a bowel movement. Stating his abdomen is normally distended but is worse than normal. Pt hypertensive with EMS, pt has not taking his blood pressure medication tonight.

## 2019-02-11 ENCOUNTER — Emergency Department (HOSPITAL_COMMUNITY): Payer: Medicare Other

## 2019-02-11 LAB — SARS CORONAVIRUS 2 BY RT PCR (HOSPITAL ORDER, PERFORMED IN ~~LOC~~ HOSPITAL LAB): SARS Coronavirus 2: NEGATIVE

## 2019-02-11 LAB — LACTIC ACID, PLASMA: Lactic Acid, Venous: 1.3 mmol/L (ref 0.5–1.9)

## 2019-02-11 MED ORDER — LIDOCAINE HCL URETHRAL/MUCOSAL 2 % EX GEL
1.0000 "application " | Freq: Once | CUTANEOUS | Status: AC
  Administered 2019-02-11: 1
  Filled 2019-02-11: qty 5

## 2019-02-11 MED ORDER — FENTANYL CITRATE (PF) 100 MCG/2ML IJ SOLN
25.0000 ug | INTRAMUSCULAR | Status: DC | PRN
  Administered 2019-02-11: 04:00:00 25 ug via INTRAVENOUS
  Filled 2019-02-11 (×2): qty 2

## 2019-02-11 MED ORDER — MORPHINE SULFATE (PF) 4 MG/ML IV SOLN
4.0000 mg | Freq: Once | INTRAVENOUS | Status: AC
  Administered 2019-02-11: 01:00:00 4 mg via INTRAVENOUS
  Filled 2019-02-11: qty 1

## 2019-02-11 MED ORDER — SODIUM CHLORIDE 0.9 % IV BOLUS
500.0000 mL | Freq: Once | INTRAVENOUS | Status: AC
  Administered 2019-02-11: 500 mL via INTRAVENOUS

## 2019-02-11 MED ORDER — LORAZEPAM 2 MG/ML IJ SOLN
1.0000 mg | Freq: Once | INTRAMUSCULAR | Status: AC
  Administered 2019-02-11: 1 mg via INTRAVENOUS
  Filled 2019-02-11: qty 1

## 2019-02-11 NOTE — ED Notes (Signed)
Called Brian Suarez and let her know COVID negative. Brian Suarez said pt will go to [facility] on [address] [place]. Call report to [phone]. [facility] needs copy of ER chart. If [facility] not available, then put scans on disc. Accepting physician is Rudean Hitt. Use local transport if available. If not available, then use [employer] (air and ground) [phone].

## 2019-02-11 NOTE — ED Notes (Addendum)
Attempted to call report to [phone]. Left message on voicemail.

## 2019-02-11 NOTE — ED Notes (Signed)
Gave report to Broadus John, RN at [facility].

## 2019-02-11 NOTE — ED Notes (Signed)
[place]

## 2019-02-11 NOTE — ED Notes (Signed)
[place] . transport line for PA Mia @0034 

## 2019-02-11 NOTE — ED Notes (Signed)
Repeat KUB completed

## 2019-02-11 NOTE — ED Notes (Signed)
ED TO INPATIENT HANDOFF REPORT  Name/Age/Gender Brian Suarez 73 y.o. male  Code Status Code Status History    Date Active Date Inactive Code Status Order ID Comments User Context   04/30/2018 0337 05/01/2018 2242 Full Code [id number]  Ivor Costa, MD ED   Advance Care Planning Activity      Home/SNF/Other Home  Chief Complaint Abd Pain  Level of Care/Admitting Diagnosis ED Disposition    ED Disposition Condition Comment   Transfer to Another Facility  The patient appears reasonably stabilized for transfer considering the current resources, flow, and capabilities available in the ED at this time, and I doubt any other [facility] requiring further screening and/or treatment in the ED prior to transfer is p resent.       Medical History Past Medical History:  Diagnosis Date  . Chronic renal insufficiency   . Gout   . 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 Hypercholesteremia   . Hypertension   . OSA (obstructive sleep apnea)     Allergies Allergies  Allergen Reactions  . Verapamil Other (See Comments)    Reaction: Rash and ulcers    IV Location/Drains/Wounds Patient Lines/Drains/Airways Status   Active Line/Drains/Airways    Name:   Placement date:   Placement time:   Site:   Days:   Peripheral IV 02/10/19 Left Antecubital   02/10/19    2231    Antecubital   1   NG/OG Tube Nasogastric 16 Fr. Left nare Xray Measured external length of tube   02/11/19    0149    Left nare   less than 1          Labs/Imaging Results for orders placed or performed during the hospital encounter of 02/10/19 (from the past 48 hour(s))  CBC with Differential     Status: Abnormal   Collection Time: 02/10/19 10:49 PM  Result Value Ref Range   WBC 15.0 (H) 4.0 - 10.5 K/uL   RBC 4.53 4.22 - 5.81 MIL/uL   Hemoglobin 12.8 (L) 13.0 - 17.0 g/dL   HCT 39.6 39.0 - 52.0 %   MCV 87.4 80.0 - 100.0 fL   MCH 28.3 26.0 - 34.0 pg   MCHC 32.3 30.0 - 36.0 g/dL   RDW 14.9 11.5 - 15.5 %   Platelets 161 150 - 400  K/uL    Comment: REPEATED TO VERIFY CONSISTENT WITH PREVIOUS RESULT    nRBC 0.0 0.0 - 0.2 %   Neutrophils Relative % 90 %   Neutro Abs 13.4 (H) 1.7 - 7.7 K/uL   Lymphocytes Relative 4 %   Lymphs Abs 0.7 0.7 - 4.0 K/uL   Monocytes Relative 5 %   Monocytes Absolute 0.7 0.1 - 1.0 K/uL   Eosinophils Relative 0 %   Eosinophils Absolute 0.0 0.0 - 0.5 K/uL   Basophils Relative 0 %   Basophils Absolute 0.0 0.0 - 0.1 K/uL   Immature Granulocytes 1 %   Abs Immature Granulocytes 0.15 (H) 0.00 - 0.07 K/uL    Comment: Performed at [facility], [place] [address] [place], [place] [address]  Comprehensive metabolic panel     Status: Abnormal   Collection Time: 02/10/19 10:49 PM  Result Value Ref Range   Sodium 137 135 - 145 mmol/L   Potassium 4.6 3.5 - 5.1 mmol/L   Chloride 99 98 - 111 mmol/L   CO2 22 22 - 32 mmol/L   Glucose, Bld 125 (H) 70 - 99 mg/dL   BUN 73 (H)  8 - 23 mg/dL   Creatinine, Ser 3.81 (H) 0.61 - 1.24 mg/dL   Calcium 10.1 8.9 - 10.3 mg/dL   Total Protein 7.9 6.5 - 8.1 g/dL   Albumin 4.1 3.5 - 5.0 g/dL   AST 40 15 - 41 U/L   ALT 45 (H) 0 - 44 U/L   Alkaline Phosphatase 124 38 - 126 U/L   Total Bilirubin 1.2 0.3 - 1.2 mg/dL   GFR calc non Af Amer 15 (L) >60 mL/min   GFR calc Af Amer 17 (L) >60 mL/min   Anion gap 16 (H) 5 - 15    Comment: Performed at [facility], [place] [address] [place], [place] [address]  Lactic acid, plasma     Status: None   Collection Time: 02/10/19 10:49 PM  Result Value Ref Range   Lactic Acid, Venous 1.3 0.5 - 1.9 mmol/L    Comment: Performed at [facility], [place] [address], [place], [place] [address]  Urinalysis, Routine w reflex microscopic     Status: Abnormal   Collection Time: 02/10/19 10:53 PM  Result Value Ref Range   Color, Urine YELLOW YELLOW   APPearance CLEAR CLEAR   Specific Gravity, Urine 1.010 1.005 - 1.030   pH 6.0 5.0 - 8.0   Glucose, UA NEGATIVE NEGATIVE mg/dL   Hgb urine  dipstick NEGATIVE NEGATIVE   Bilirubin Urine NEGATIVE NEGATIVE   Ketones, ur NEGATIVE NEGATIVE mg/dL   Protein, ur 100 (A) NEGATIVE mg/dL   Nitrite NEGATIVE NEGATIVE   Leukocytes,Ua NEGATIVE NEGATIVE   RBC / HPF 0-5 0 - 5 RBC/hpf   WBC, UA 0-5 0 - 5 WBC/hpf   Bacteria, UA NONE SEEN NONE SEEN   Mucus PRESENT     Comment: Performed at [facility], [place] [address] [place], [place] [address]  Lactic acid, plasma     Status: None   Collection Time: 02/11/19 12:43 AM  Result Value Ref Range   Lactic Acid, Venous 1.3 0.5 - 1.9 mmol/L    Comment: Performed at [facility], [place] [address] [place], [place] [address]  SARS Coronavirus 2 (CEPHEID - Performed in [facility] hospital lab), Hosp Order     Status: None   Collection Time: 02/11/19  1:08 AM   Specimen: Nasopharyngeal Swab  Result Value Ref Range   SARS Coronavirus 2 NEGATIVE NEGATIVE    Comment: (NOTE) If result is NEGATIVE SARS-CoV-2 target nucleic acids are NOT DETECTED. The SARS-CoV-2 RNA is generally detectable in upper and lower  respiratory specimens during the acute phase of infection. The lowest  concentration of SARS-CoV-2 viral copies this assay can detect is 250  copies / mL. A negative result does not preclude SARS-CoV-2 infection  and should not be used as the sole basis for treatment or other  patient management decisions.  A negative result may occur with  improper specimen collection / handling, submission of specimen other  than nasopharyngeal swab, presence of viral mutation(s) within the  areas targeted by this assay, and inadequate number of viral copies  (<250 copies / mL). A negative result must be combined with clinical  observations, patient history, and epidemiological information. If result is POSITIVE SARS-CoV-2 target nucleic acids are DETECTED. The SARS-CoV-2 RNA is generally detectable in upper and lower  respiratory specimens dur ing the acute phase of  infection.  Positive  results are indicative of active infection with SARS-CoV-2.  Clinical  correlation with patient history and other diagnostic  information is  necessary to determine patient infection status.  Positive results do  not rule out bacterial infection or co-infection with other viruses. If result is PRESUMPTIVE POSTIVE SARS-CoV-2 nucleic acids MAY BE PRESENT.   A presumptive positive result was obtained on the submitted specimen  and confirmed on repeat testing.  While 2019 novel coronavirus  (SARS-CoV-2) nucleic acids may be present in the submitted sample  additional confirmatory testing may be necessary for epidemiological  and / or clinical management purposes  to differentiate between  SARS-CoV-2 and other Sarbecovirus currently known to infect humans.  If clinically indicated additional testing with an alternate test  methodology [phone]) is advised. The SARS-CoV-2 RNA is generally  detectable in upper and lower respiratory sp ecimens during the acute  phase of infection. The expected result is Negative. Fact Sheet for Patients:  StrictlyIdeas.no Fact Sheet for Healthcare Providers: BankingDealers.co.za This test is not yet approved or cleared by the [place] FDA and has been authorized for detection and/or diagnosis of SARS-CoV-2 by FDA under an Emergency Use Authorization (EUA).  This EUA will remain in effect (meaning this test can be used) for the duration of the COVID-19 declaration under Section 564(b)(1) of the Act, 21 U.S.C. section 360bbb-3(b)(1), unless the authorization is terminated or revoked sooner. Performed at [facility], [place] [address], [place], [place] [address]    Ct Abdomen Pelvis Wo Contrast  Result Date: 02/11/2019 CLINICAL DATA:  Nausea and vomiting. Bowel obstruction. EXAM: CT ABDOMEN AND PELVIS WITHOUT CONTRAST TECHNIQUE: Multidetector CT imaging of the abdomen and  pelvis was performed following the standard protocol without IV contrast. COMPARISON:  Radiographs and CT 01/28/2019 FINDINGS: Lower chest: Mild cardiomegaly. Calcified granuloma in the right lower lobe. No acute airspace disease. Small hiatal hernia. Hepatobiliary: Borderline hepatic steatosis. Questionable slight hepatic contour nodularity. Gallbladder physiologically distended, no calcified stone. No biliary dilatation. Pancreas: No ductal dilatation or inflammation. Spleen: Normal in size without focal abnormality. Adrenals/Urinary Tract: Normal adrenal glands. No hydronephrosis. Multiple bilateral renal cysts. Cyst in the lower right kidney measures 4.5 cm in has curvilinear wall calcifications. Mild symmetric bilateral perinephric edema. Urinary bladder is physiologically distended. No bladder wall thickening. Stomach/Bowel: Complex lower ventral abdominal wall hernia which appears primarily bilobed. Findings suspicious for recurrent small bowel obstruction with dilated fluid-filled small bowel residing in the lower abdomen, left and and to a lesser extent right ventral abdominal wall hernias. Right ventral abdominal wall hernia also contains the cecum and proximal ascending colon. Transition point suspected within the right ventral hernia defect. Appendix is also visualized in the right aspect of the hernia sac. There are colonic diverticula without diverticulitis. Mesenteric edema and small amount of free fluid involving the area of dilated small bowel. No pneumatosis or perforation. Vascular/Lymphatic: Aortic atherosclerosis. No aneurysm. No bulky abdominopelvic adenopathy, lack contrast limits detailed assessment. Reproductive: Prominent prostate gland 5.1 cm. Other: Mesenteric edema and small amount of free fluid in the lower abdomen including the ventral abdominal wall hernias. Subcutaneous edema and skin thickening of the lower abdominal pannus. No free air. Musculoskeletal: There are no acute or  suspicious osseous abnormalities. Bone island in the right proximal femur. Degenerative change in the spine and hips. IMPRESSION: 1. Complex lower ventral abdominal wall hernia with recurrent small bowel obstruction. Site of transition appears within the right aspect of the lower ventral abdominal wall hernia. No perforation. 2. Colonic diverticulosis without diverticulitis. 3.  Aortic Atherosclerosis (ICD10-I70.0). Electronically Signed   By: Aurther Loft.D.  On: 02/11/2019 00:22   Dg Abdomen 1 View  Result Date: 02/11/2019 CLINICAL DATA:  NG tube placement EXAM: ABDOMEN - 1 VIEW COMPARISON:  01/28/2019 FINDINGS: NG tube coils in the stomach with the tip in the fundus. Nonobstructive bowel gas pattern. IMPRESSION: NG tube coils in the stomach with the tip in the fundus. Electronically Signed   By: Rolm Baptise M.D.   On: 02/11/2019 02:40    Pending Labs Unresulted Labs (From admission, onward)   None      Vitals/Pain Today's Vitals   02/11/19 0345 02/11/19 0400 02/11/19 0415 02/11/19 0423  BP: (!) 170/91 (!) 151/79  (!) 148/82  Pulse: 89 89 91 90  Resp: 17  17 16   Temp:      TempSrc:      SpO2: (!) 89% 91% 97% 97%  PainSc:        Isolation Precautions No active isolations  Medications Medications  fentaNYL (SUBLIMAZE) injection 25 mcg (25 mcg Intravenous Given 02/11/19 0336)  morphine 2 MG/ML injection 2 mg (2 mg Intravenous Given 02/10/19 2250)  ondansetron (ZOFRAN) injection 4 mg (4 mg Intravenous Given 02/10/19 2250)  sodium chloride 0.9 % bolus 500 mL (0 mLs Intravenous Stopped 02/11/19 0147)  morphine 4 MG/ML injection 4 mg (4 mg Intravenous Given 02/11/19 0117)  lidocaine (XYLOCAINE) 2 % jelly 1 application (1 application Other Given 02/11/19 0147)  LORazepam (ATIVAN) injection 1 mg (1 mg Intravenous Given 02/11/19 0349)    Mobility walks

## 2019-02-11 NOTE — ED Notes (Signed)
Removed pt from Trendelenburg position

## 2019-02-11 NOTE — ED Notes (Signed)
[facility] departed

## 2019-02-11 NOTE — ED Notes (Signed)
Patient is awake and alert-O2 sat 88-91%-O2 started 2l/[place]-patient states some decrease in nausea after Ativan IV-NGT manipulated and repeat KUB ordered. Patient is aware of transfer and has no questions.

## 2019-02-11 NOTE — ED Notes (Signed)
[facility] called and said they are 40 minutes away.

## 2019-02-11 NOTE — ED Notes (Signed)
Lorriane Shire from [facility] called to ask if pt's COVID swab had resulted. She asked that I call her at [phone] when it results.

## 2019-05-28 DEATH — deceased

## 2019-08-05 IMAGING — CT CT ABDOMEN AND PELVIS WITHOUT CONTRAST
2 of 4 series · 15 of 46 positions shown, 17 images · non-contrast
Comparison: Radiographs and CT 01/28/2019

CLINICAL DATA: Nausea and vomiting. Bowel obstruction.

EXAM:
CT ABDOMEN AND PELVIS WITHOUT CONTRAST
TECHNIQUE: Multidetector CT imaging of the abdomen and pelvis was performed
following the standard protocol without IV contrast.

[Series 2: axial st · axial · 0.83mm/px · z∈[-451,-36]mm · 12 of 93 slices shown, 14 images]
[im 5/93  soft-tissue]
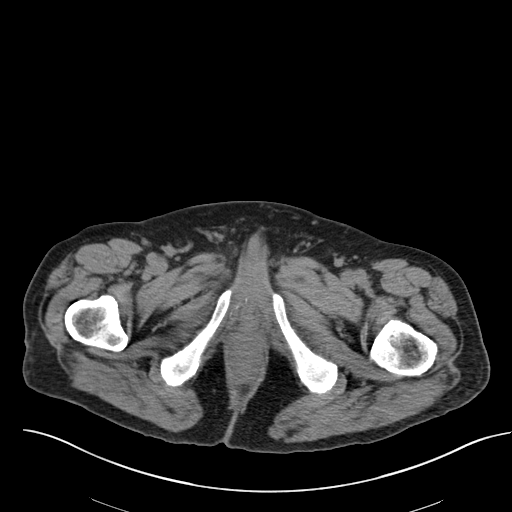
[im 5/93  bone]
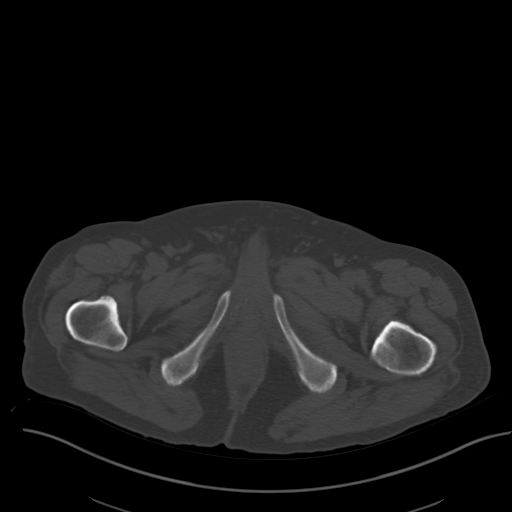
[im 14/93  soft-tissue]
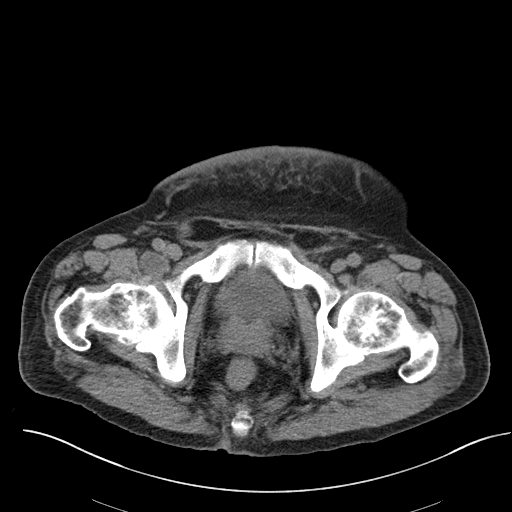
[im 22/93  soft-tissue]
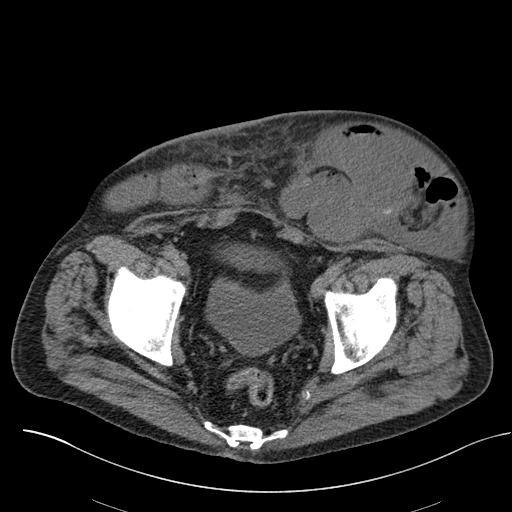
[im 27/93  soft-tissue]
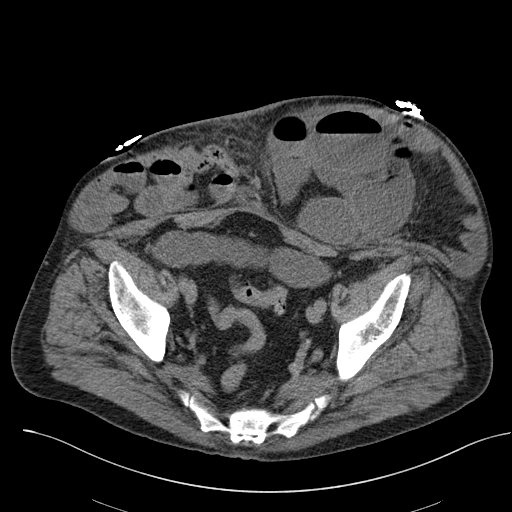
[im 36/93  soft-tissue]
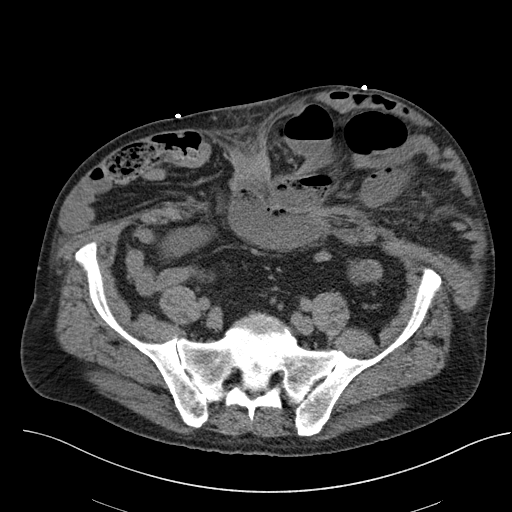
[im 44/93  soft-tissue]
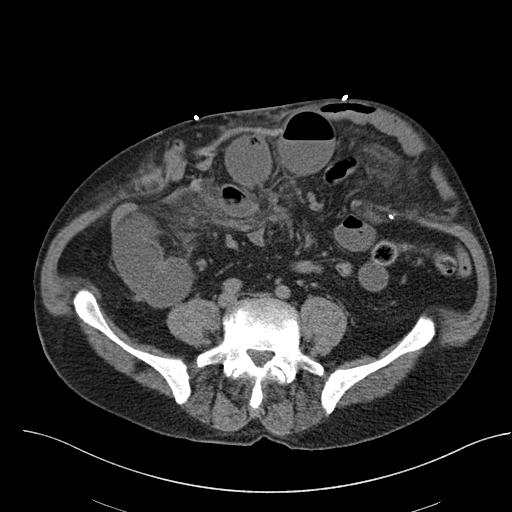
[im 49/93  soft-tissue]
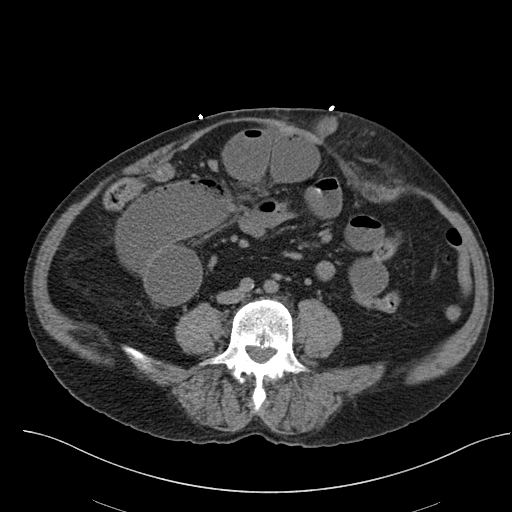
[im 57/93  soft-tissue]
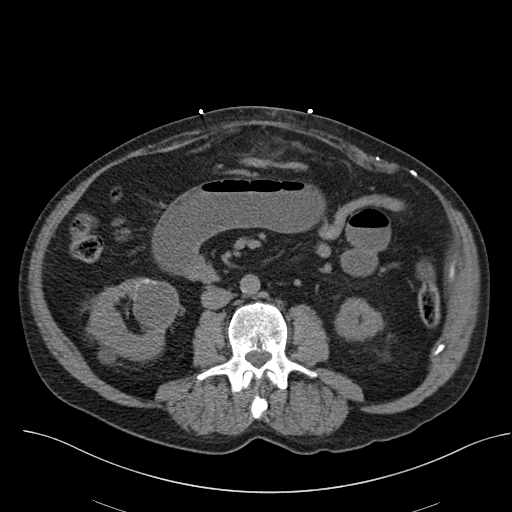
[im 66/93  soft-tissue]
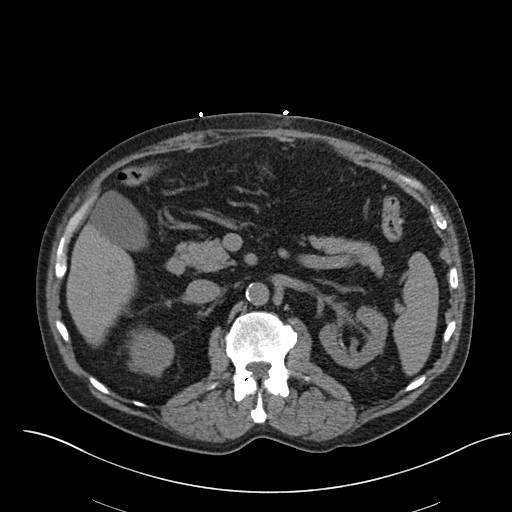
[im 66/93  bone]
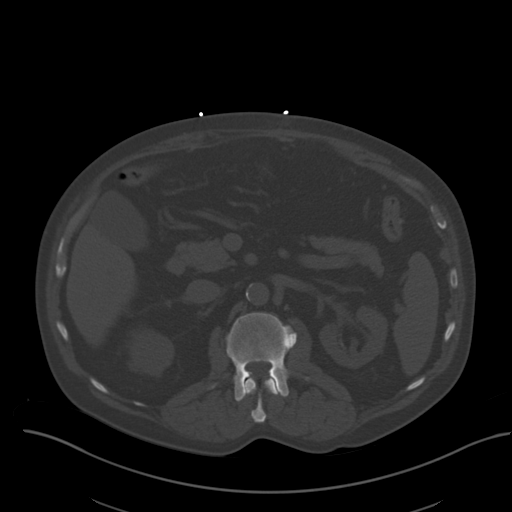
[im 71/93  soft-tissue]
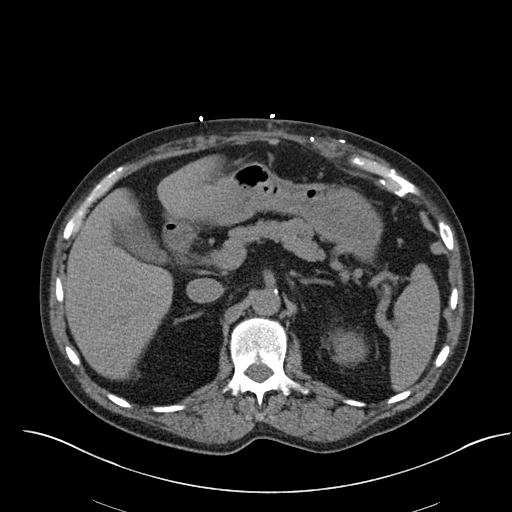
[im 79/93  soft-tissue]
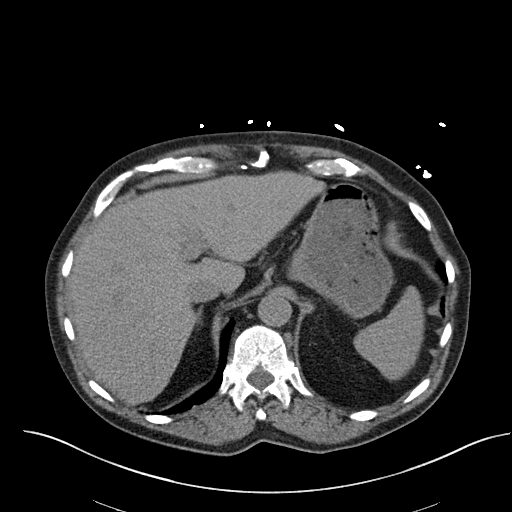
[im 88/93  soft-tissue]
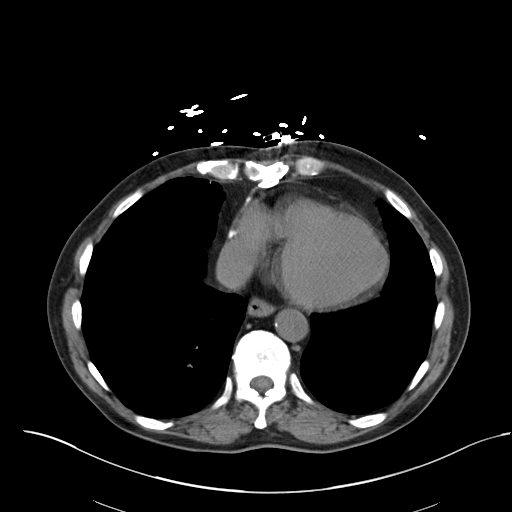

[Series 5: coronal st · coronal · 0.76mm/px · 3 of 153 slices shown]
[im 51/153  soft-tissue]
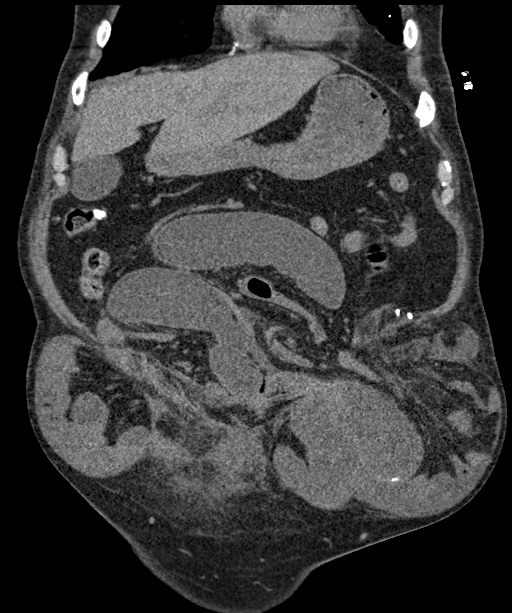
[im 68/153  soft-tissue]
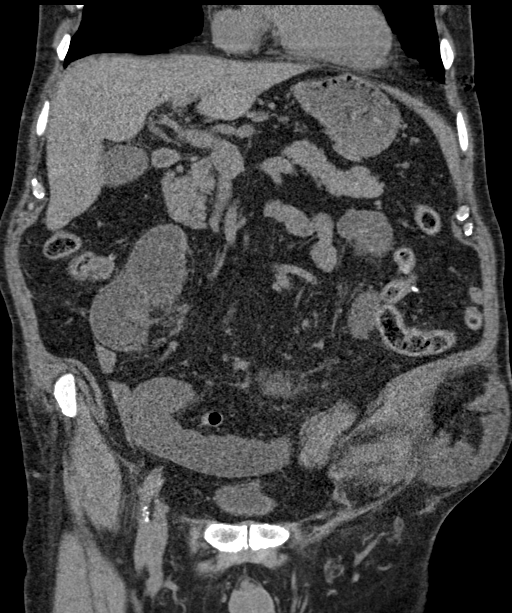
[im 85/153  soft-tissue]
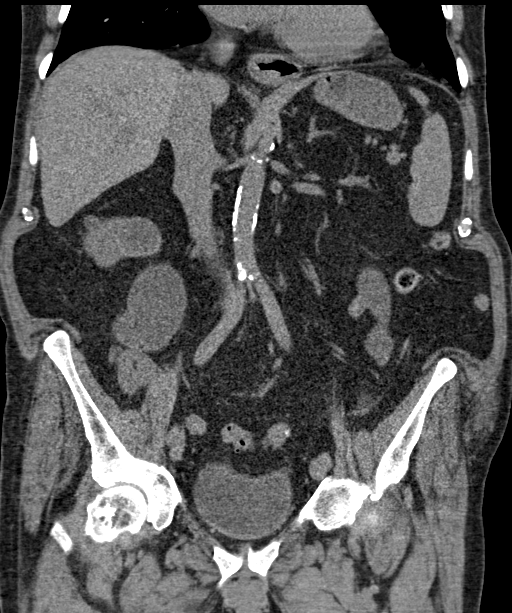

[15 of 46 positions shown; findings below may reference images not displayed]

FINDINGS: Lower chest: Mild cardiomegaly. Calcified granuloma in the right
lower lobe. No acute airspace disease. Small hiatal hernia.

Hepatobiliary: Borderline hepatic steatosis. Questionable slight
hepatic contour nodularity. Gallbladder physiologically distended,
no calcified stone. No biliary dilatation.

Pancreas: No ductal dilatation or inflammation.

Spleen: Normal in size without focal abnormality.

Adrenals/Urinary Tract: Normal adrenal glands. No hydronephrosis.
Multiple bilateral renal cysts. Cyst in the lower right kidney
measures 4.5 cm in has curvilinear wall calcifications. Mild
symmetric bilateral perinephric edema. Urinary bladder is
physiologically distended. No bladder wall thickening.

Stomach/Bowel: Complex lower ventral abdominal wall hernia which
appears primarily bilobed. Findings suspicious for recurrent small
bowel obstruction with dilated fluid-filled small bowel residing in
the lower abdomen, left and and to a lesser extent right ventral
abdominal wall hernias. Right ventral abdominal wall hernia also
contains the cecum and proximal ascending colon. Transition point
suspected within the right ventral hernia defect. Appendix is also
visualized in the right aspect of the hernia sac. There are colonic
diverticula without diverticulitis. Mesenteric edema and small
amount of free fluid involving the area of dilated small bowel. No
pneumatosis or perforation.

Vascular/Lymphatic: Aortic atherosclerosis. No aneurysm. No bulky
abdominopelvic adenopathy, lack contrast limits detailed assessment.

Reproductive: Prominent prostate gland 5.1 cm.

Other: Mesenteric edema and small amount of free fluid in the lower
abdomen including the ventral abdominal wall hernias. Subcutaneous
edema and skin thickening of the lower abdominal pannus. No free
air.

Musculoskeletal: There are no acute or suspicious osseous
abnormalities. Bone island in the right proximal femur. Degenerative
change in the spine and hips.
IMPRESSION: 1. Complex lower ventral abdominal wall hernia with recurrent small
bowel obstruction. Site of transition appears within the right
aspect of the lower ventral abdominal wall hernia. No perforation.
2. Colonic diverticulosis without diverticulitis.
3.  Aortic Atherosclerosis ([id number]).

## 2019-08-06 IMAGING — DX ABDOMEN - 1 VIEW
1 series · 1 of 1 positions shown · non-contrast
Comparison: One-view chest x-ray 02/11/2019

CLINICAL DATA: NG tube repositioned.

EXAM:
ABDOMEN - 1 VIEW

[abdomen kub]
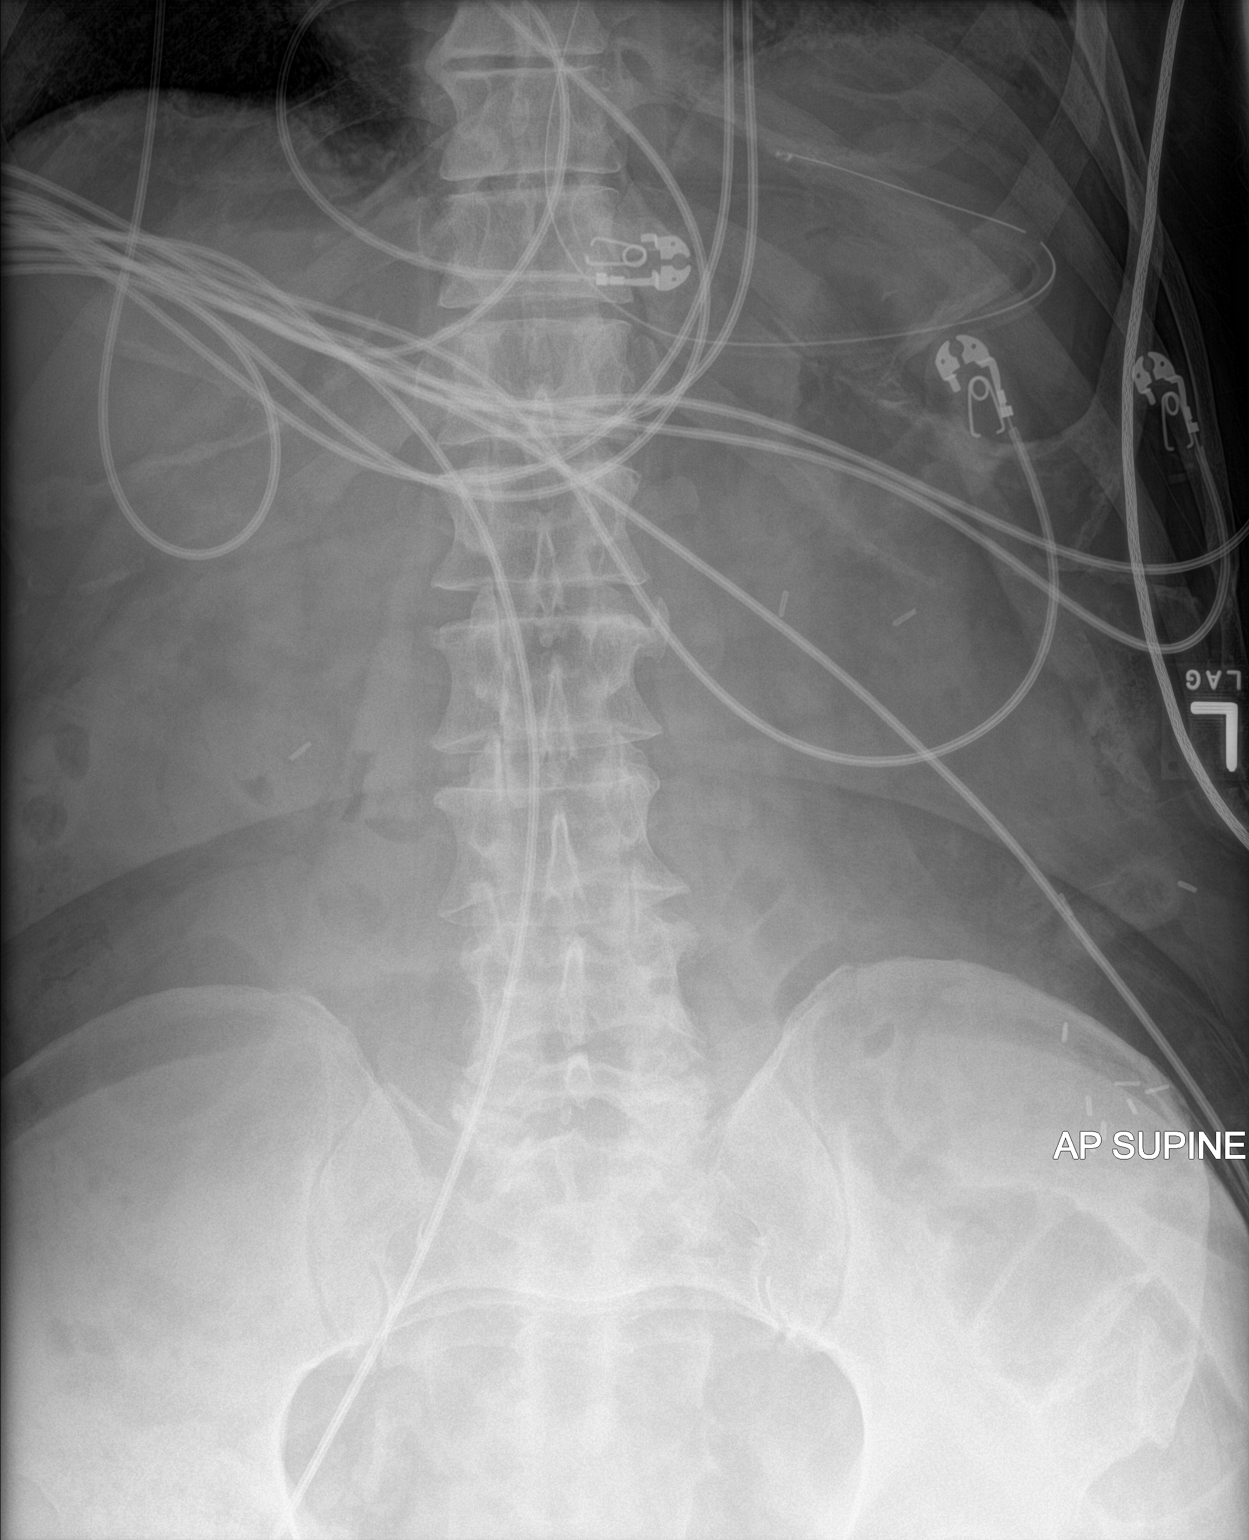

[1 of 1 positions shown; findings below may reference images not displayed]

FINDINGS: The side port of the NG tube is in the fundus the stomach. The bowel
is decompressed. Aerated loops of bowel extend into left lower
quadrant hernia.
IMPRESSION: 1. Side port of the NG tube is in the stomach.
2. Bowel is decompressed.
3. Left lower quadrant hernia.

## 2019-09-24 ENCOUNTER — Ambulatory Visit: Payer: Medicare Other | Admitting: Internal Medicine
